# Patient Record
Sex: Female | Born: 1955 | Race: White | Hispanic: No | Marital: Married | Smoking: Current some day smoker
Health system: Southern US, Community
[De-identification: ages and names within clinical notes are randomized; demographics above are authoritative.]

## PROBLEM LIST (undated history)

## (undated) DIAGNOSIS — E782 Mixed hyperlipidemia: Secondary | ICD-10-CM

## (undated) DIAGNOSIS — I2510 Atherosclerotic heart disease of native coronary artery without angina pectoris: Secondary | ICD-10-CM

## (undated) DIAGNOSIS — E7800 Pure hypercholesterolemia, unspecified: Secondary | ICD-10-CM

## (undated) DIAGNOSIS — K219 Gastro-esophageal reflux disease without esophagitis: Secondary | ICD-10-CM

## (undated) DIAGNOSIS — T1490XA Injury, unspecified, initial encounter: Secondary | ICD-10-CM

## (undated) DIAGNOSIS — E119 Type 2 diabetes mellitus without complications: Secondary | ICD-10-CM

## (undated) DIAGNOSIS — M1990 Unspecified osteoarthritis, unspecified site: Secondary | ICD-10-CM

## (undated) DIAGNOSIS — F0781 Postconcussional syndrome: Secondary | ICD-10-CM

## (undated) DIAGNOSIS — F439 Reaction to severe stress, unspecified: Secondary | ICD-10-CM

## (undated) DIAGNOSIS — I10 Essential (primary) hypertension: Secondary | ICD-10-CM

## (undated) HISTORY — DX: Mixed hyperlipidemia: E78.2

## (undated) HISTORY — PX: OTHER SURGICAL HISTORY: SHX169

## (undated) HISTORY — PX: EYE SURGERY: SHX253

## (undated) HISTORY — DX: Reaction to severe stress, unspecified: F43.9

## (undated) HISTORY — DX: Injury, unspecified, initial encounter: T14.90XA

## (undated) HISTORY — DX: Type 2 diabetes mellitus without complications: E11.9

## (undated) HISTORY — DX: Atherosclerotic heart disease of native coronary artery without angina pectoris: I25.10

---

## 2001-01-04 ENCOUNTER — Inpatient Hospital Stay (HOSPITAL_COMMUNITY): Admission: EM | Admit: 2001-01-04 | Discharge: 2001-01-06 | Payer: Self-pay | Admitting: *Deleted

## 2001-01-07 ENCOUNTER — Other Ambulatory Visit (HOSPITAL_COMMUNITY): Admission: RE | Admit: 2001-01-07 | Discharge: 2001-01-13 | Payer: Self-pay | Admitting: Psychiatry

## 2003-06-22 ENCOUNTER — Encounter: Payer: Self-pay | Admitting: *Deleted

## 2003-06-22 ENCOUNTER — Emergency Department (HOSPITAL_COMMUNITY): Admission: EM | Admit: 2003-06-22 | Discharge: 2003-06-22 | Payer: Self-pay | Admitting: *Deleted

## 2007-10-07 ENCOUNTER — Other Ambulatory Visit: Admission: RE | Admit: 2007-10-07 | Discharge: 2007-10-07 | Payer: Self-pay | Admitting: Obstetrics and Gynecology

## 2008-10-19 ENCOUNTER — Other Ambulatory Visit: Admission: RE | Admit: 2008-10-19 | Discharge: 2008-10-19 | Payer: Self-pay | Admitting: Obstetrics and Gynecology

## 2009-11-29 ENCOUNTER — Other Ambulatory Visit: Admission: RE | Admit: 2009-11-29 | Discharge: 2009-11-29 | Payer: Self-pay | Admitting: Obstetrics and Gynecology

## 2010-12-02 ENCOUNTER — Other Ambulatory Visit (HOSPITAL_COMMUNITY)
Admission: RE | Admit: 2010-12-02 | Discharge: 2010-12-02 | Disposition: A | Discharge status: Home / Self Care | Payer: 59 | Source: Ambulatory Visit | Attending: Obstetrics and Gynecology | Admitting: Obstetrics and Gynecology

## 2010-12-02 ENCOUNTER — Other Ambulatory Visit: Payer: Self-pay | Admitting: Adult Health

## 2010-12-02 DIAGNOSIS — Z01419 Encounter for gynecological examination (general) (routine) without abnormal findings: Secondary | ICD-10-CM | POA: Insufficient documentation

## 2011-03-20 NOTE — Procedures (Signed)
   NAME:  Barbara Phillips, Barbara Phillips                            ACCOUNT NO.:  [id number]   MEDICAL RECORD NO.:  [id number]                   PATIENT TYPE:  EMS   LOCATION:  ED                                   FACILITY:  [facility]   PHYSICIAN:  Edward L. Juanetta Gosling, M.D.             DATE OF BIRTH:  04/02/1956   DATE OF PROCEDURE:  DATE OF DISCHARGE:  06/22/2003                                EKG INTERPRETATION   TIME AND DATE:  2022 on 06/22/2003   INTERPRETATION:  The rhythm is sinus rhythm with a rate in the 70s   IMPRESSION:  Normal electrocardiogram.                                               Ramon Dredge L. Juanetta Gosling, M.D.    ELH/MEDQ  D:  06/25/2003  T:  06/25/2003  Job:  [id number]

## 2011-03-20 NOTE — Discharge Summary (Signed)
[facility]  Patient:    Barbara Phillips, Barbara Phillips                         MRN: [id number] Adm. Date:  [id number] Attending:  Milford Cage H                           Discharge Summary  REASON FOR ADMISSION:  Patient is a 55 year old Caucasian female from [facility] referred from the [facility] Intensive Care Unit. Patient had been admitted due to an overdose of Xanax and alcohol.  She was medically stabilized and transferred here.  She states she has been having conflict with her husband of 8 months.  She also has a number of other stressors, including a demanding job and financial problems.  She recognizes now it was "silly" to try to harm herself, but had felt so overwhelmed she did not know where to turn.  For further admission information, see psychiatric admission assessment.  PHYSICAL EXAMINATION:  Patient has a history of chronic bronchitis from smoking.  She is otherwise healthy.  ADMISSION LABORATORIES:  Comprehensive metabolic panel was grossly within normal limits.  CBC was within normal limits.  Free T4 and TSH were within normal limits.  The rest of her labs had been done at the Intensive care unit at [facility] and were evaluated by their physician there.  HOSPITAL COURSE:  Upon admission, patient was begun on Celexa 10 mg q.a.m. x 1 day, then 20 mg q.a.m.  She states she has been on Zoloft in the past but had not stayed on it long enough to be helpful.  She had had some GI side effects to this.  She was able to talk with her husband while here and began to feel more optimistic about the marriage.  A family session was scheduled with him prior to discharge to solidify her disposition.  Patient was able to participate appropriately in unit therapeutic groups and activities.  Her mood had improved markedly at the time of discharge.  DISCHARGE MENTAL STATUS EXAMINATION:  Improved eye contact.  Less psychomotor retardation.  Speech  normal rate and slow.  Mood less depressed, affect brighter, more positive, smiling.  No suicidal or homicidal ideations, no self injurious behavior or aggression.  There was no psychosis or perceptual disturbance.  Thought processes were logical and goal directed.  Cognitive exam:  Patient was back to baseline.  Attention and concentration still somewhat diminished.  Judgment good, insight good.  DISCHARGE DIAGNOSIS: Axis I:     Major depression, recurrent, severe, without psychosis. Axis II:    None. Axis III:   Recent overdose on Xanax and alcohol, medically treated and             currently stable. Axis IV:    Severe. Axis V:     Current global assessment of function is 50, highest past year 75,             admission status 30.  DISCHARGE MEDICATIONS:  Celexa 20 mg p.o. q.a.m.  ACTIVITY RESTRICTIONS:  None.  DIET RESTRICTIONS:  None.  POST HOSPITAL CARE PLAN:  Patient will return to the partial hospital program at [facility] and then will continue to see me at [facility] outpatient behavioral clinic. DD:  01/06/01 TD:  01/06/01 Job: [id number]

## 2011-03-20 NOTE — H&P (Signed)
[facility]  Patient:    Barbara Phillips, Barbara Phillips                         MRN: [id number] Adm. Date:  [id number] Attending:  Jasmine Pang                   Psychiatric Admission Assessment  DATE OF ADMISSION:  January 04, 2001  PATIENT IDENTIFICATION:  A 55 year old Caucasian female from [facility] who was referred from the [facility] Intensive Care Unit.  HISTORY OF PRESENT ILLNESS:  The patient had taken an overdose of approximately 50 Xanax and was drinking alcohol (blood alcohol level 0.15). She states a history of escalating depression which began in July after having to take over the administration of her fathers estate.  He had died three years ago.  She also has gotten remarried and is having conflict with her new husband.  He had made a statement about wanting to leave her, which led to the overdose attempt.  She has been worried about her sons displeasure regarding her remarriage.  She also states she is having financial problems and work related stresses.  PAST PSYCHIATRIC HISTORY:  The patient was depressed last year, treated with Zoloft by her primary care physician.  She had Xanax prescribed in June 2000 for sleep; this is what she overdosed on.  She denied using this on a regular basis.  SUBSTANCE ABUSE HISTORY:  The patient describes herself as a social drinker. She denies using any drugs.  She does smoke one pack of cigarettes per day.  PAST MEDICAL HISTORY:  Primary care Kesa Birky is Amy Johny Sax. at [facility] in [place], [place].  Medical problems: Recent overdose as described above, no other acute health problems except recent sinus infections and flu.  Medications: None.  Drug allergies: No known drug allergies.  SOCIAL HISTORY:  Family psychiatric history: Mother has been depressed for a years.  She has been treated with medications but the patient is unsure of what kind.  Her sister has had a history of  depression.  Social history: The patient is recently remarried for the third time.  She states she and her husband are having conflict.  Her son has been unhappy with the remarriage and she feels anxious about keeping him happy.  She works as a Merchandiser, retail for a [employer]  She states this is a very stressful job.  She denies any legal problems.  ADMISSION MENTAL STATUS EXAMINATION:  Sad, tearful Caucasian female dressed casually with poor eye contact.  Speech: Soft and slow.  Psychomotor retardation.  Mood was depressed, anxious.  Affect: Sad, tearful.  Positive suicidal ideation as per history of present illness; she denies this now.  No homicidal ideation, no psychosis or perceptual disturbance.  Thought processes were logical and goal directed.  Thought content revolves predominantly around the status of her marriage and whether she and her husband are going to stay together.  Cognitive: Alert and oriented x 4.  Short-term and long-term memory were adequate.  General fund of knowledge: Age and education level appropriate.  Attention and concentration: Decreased.  Judgment: Poor. Insight: Fair.  ADMISSION DIAGNOSES: Axis I:    Major depression, recurrent, severe without psychosis. Axis II:   Deferred. Axis III:  Recent overdose on Xanax and alcohol. Axis IV:   Severe. Axis V:    Global assessment of functioning current 30, highest past year 75.  ASSETS AND  STRENGTHS:  Verbal, healthy, supportive extended family.  Problems: Mood instability with suicidal ideation.  INITIAL PLAN OF CARE:  Short-term treatment goal: Resolution of suicidal ideation.  Long-term treatment goal: Resolution of mood instability.  Plan: Begin Celexa 10 mg q.a.m. tomorrow, then 20 mg q.a.m. if tolerated.  Will have case management schedule a family session with husband to determine status of their marriage and disposition issues.  The patient will also be involved in unit therapeutic groups and  activities to decreased suicidal ideation and improve coping strategies.  ESTIMATED LENGTH OF STAY:  Three to five days.  CONDITIONS NECESSARY FOR DISCHARGE:  Not suicidal.  POST HOSPITAL CARE PLAN:  Return to her home.  Follow-up therapy and medication management will be arranged at her local mental health center in [facility] or with a [place] of choice.  These appointments will be made prior to her discharge. DD:  01/04/01 TD:  01/05/01 Job: [id number]

## 2011-05-13 DIAGNOSIS — R079 Chest pain, unspecified: Secondary | ICD-10-CM

## 2011-05-13 DIAGNOSIS — R002 Palpitations: Secondary | ICD-10-CM

## 2011-12-04 ENCOUNTER — Other Ambulatory Visit: Payer: Self-pay | Admitting: Adult Health

## 2011-12-04 ENCOUNTER — Other Ambulatory Visit (HOSPITAL_COMMUNITY)
Admission: RE | Admit: 2011-12-04 | Discharge: 2011-12-04 | Disposition: A | Discharge status: Home / Self Care | Payer: 59 | Source: Ambulatory Visit | Attending: Obstetrics and Gynecology | Admitting: Obstetrics and Gynecology

## 2011-12-04 DIAGNOSIS — Z01419 Encounter for gynecological examination (general) (routine) without abnormal findings: Secondary | ICD-10-CM | POA: Insufficient documentation

## 2011-12-08 ENCOUNTER — Telehealth (INDEPENDENT_AMBULATORY_CARE_PROVIDER_SITE_OTHER): Payer: Self-pay | Admitting: *Deleted

## 2011-12-08 NOTE — Telephone Encounter (Signed)
Barbara Phillips called to schedule her 5 year tcs. The last one was done in [place] of 2008. There were polops removed. Advised [name] we have her down for her tcs in 2018, 10 years. Please give her a return call at [phone].

## 2011-12-10 NOTE — Telephone Encounter (Signed)
Left message for patient to call me

## 2011-12-11 NOTE — Telephone Encounter (Signed)
Per Dr Karilyn Cota recommendations on last TCS, patient due 08/2012, she is aware

## 2012-03-09 ENCOUNTER — Encounter (INDEPENDENT_AMBULATORY_CARE_PROVIDER_SITE_OTHER): Payer: Self-pay

## 2012-07-26 ENCOUNTER — Telehealth (INDEPENDENT_AMBULATORY_CARE_PROVIDER_SITE_OTHER): Payer: Self-pay | Admitting: *Deleted

## 2012-07-26 ENCOUNTER — Other Ambulatory Visit (INDEPENDENT_AMBULATORY_CARE_PROVIDER_SITE_OTHER): Payer: Self-pay | Admitting: *Deleted

## 2012-07-26 DIAGNOSIS — Z800 Family history of malignant neoplasm of digestive organs: Secondary | ICD-10-CM

## 2012-07-26 DIAGNOSIS — Z1211 Encounter for screening for malignant neoplasm of colon: Secondary | ICD-10-CM

## 2012-07-26 DIAGNOSIS — Z86010 Personal history of colonic polyps: Secondary | ICD-10-CM

## 2012-07-26 MED ORDER — PEG-KCL-NACL-NASULF-NA ASC-C 100 G PO SOLR: 1.0000 | Freq: Once | ORAL | Status: DC

## 2012-07-26 NOTE — Telephone Encounter (Signed)
Patient needs movi prep 

## 2012-08-11 ENCOUNTER — Telehealth (INDEPENDENT_AMBULATORY_CARE_PROVIDER_SITE_OTHER): Payer: Self-pay | Admitting: *Deleted

## 2012-08-11 NOTE — Telephone Encounter (Signed)
agree

## 2012-08-11 NOTE — Telephone Encounter (Signed)
PCP/Requesting MD: amy boyd  Name & DOB: Barbara Phillips 06/16/1956   Procedure: tcs  Reason/Indication:  Hx polyps, fam hx colon ca  Has patient had this procedure before?  yes  If so, when, by whom and where?  10/08  Is there a family history of colon cancer?  yes  Who?  What age when diagnosed?  father  Is patient diabetic?   no      Does patient have prosthetic heart valve?  no  Do you have a pacemaker?  no  Has patient had joint replacement within last 12 months?  no  Is patient on Coumadin, Plavix and/or Aspirin? no  Medications: fish oil 1000 mg daily, vit d3 1000 mg daily, co q 10 200 mg daily, simvastatin 40 mg daily, omeprazole 20 mg daily  Allergies: nkda  Medication Adjustment:   Procedure date & time: 09/08/12 at 830

## 2012-08-31 ENCOUNTER — Encounter (HOSPITAL_COMMUNITY): Payer: Self-pay | Admitting: Pharmacy Technician

## 2012-09-08 ENCOUNTER — Encounter (HOSPITAL_COMMUNITY)
Admission: RE | Disposition: A | Discharge status: Home / Self Care | Payer: Self-pay | Source: Ambulatory Visit | Attending: Internal Medicine

## 2012-09-08 ENCOUNTER — Ambulatory Visit (HOSPITAL_COMMUNITY)
Admission: RE | Admit: 2012-09-08 | Discharge: 2012-09-08 | Disposition: A | Discharge status: Home / Self Care | Payer: 59 | Source: Ambulatory Visit | Attending: Internal Medicine | Admitting: Internal Medicine

## 2012-09-08 ENCOUNTER — Encounter (HOSPITAL_COMMUNITY): Payer: Self-pay | Admitting: *Deleted

## 2012-09-08 DIAGNOSIS — D126 Benign neoplasm of colon, unspecified: Secondary | ICD-10-CM | POA: Insufficient documentation

## 2012-09-08 DIAGNOSIS — Z800 Family history of malignant neoplasm of digestive organs: Secondary | ICD-10-CM

## 2012-09-08 DIAGNOSIS — K219 Gastro-esophageal reflux disease without esophagitis: Secondary | ICD-10-CM | POA: Insufficient documentation

## 2012-09-08 DIAGNOSIS — K6389 Other specified diseases of intestine: Secondary | ICD-10-CM

## 2012-09-08 DIAGNOSIS — E7800 Pure hypercholesterolemia, unspecified: Secondary | ICD-10-CM | POA: Insufficient documentation

## 2012-09-08 DIAGNOSIS — K5520 Angiodysplasia of colon without hemorrhage: Secondary | ICD-10-CM | POA: Insufficient documentation

## 2012-09-08 DIAGNOSIS — Z86010 Personal history of colonic polyps: Secondary | ICD-10-CM

## 2012-09-08 HISTORY — DX: Gastro-esophageal reflux disease without esophagitis: K21.9

## 2012-09-08 HISTORY — PX: COLONOSCOPY: SHX5424

## 2012-09-08 HISTORY — DX: Pure hypercholesterolemia, unspecified: E78.00

## 2012-09-08 SURGERY — COLONOSCOPY
Anesthesia: Moderate Sedation

## 2012-09-08 MED ORDER — STERILE WATER FOR IRRIGATION IR SOLN
Status: DC | PRN
  Administered 2012-09-08: 09:00:00

## 2012-09-08 MED ORDER — MEPERIDINE HCL 50 MG/ML IJ SOLN
INTRAMUSCULAR | Status: AC
  Filled 2012-09-08: qty 1

## 2012-09-08 MED ORDER — MIDAZOLAM HCL 5 MG/5ML IJ SOLN
INTRAMUSCULAR | Status: DC | PRN
  Administered 2012-09-08: 1 mg via INTRAVENOUS
  Administered 2012-09-08 (×2): 2 mg via INTRAVENOUS

## 2012-09-08 MED ORDER — MIDAZOLAM HCL 5 MG/5ML IJ SOLN
INTRAMUSCULAR | Status: AC
  Filled 2012-09-08: qty 10

## 2012-09-08 MED ORDER — SODIUM CHLORIDE 0.45 % IV SOLN
INTRAVENOUS | Status: DC
  Administered 2012-09-08: 08:00:00 via INTRAVENOUS

## 2012-09-08 MED ORDER — MEPERIDINE HCL 50 MG/ML IJ SOLN
INTRAMUSCULAR | Status: DC | PRN
  Administered 2012-09-08 (×2): 25 mg via INTRAVENOUS

## 2012-09-08 NOTE — Op Note (Addendum)
COLONOSCOPY PROCEDURE REPORT  PATIENT:  Barbara Phillips  MR#:  [id number] Birthdate:  [birth year], 56 y.o., female Endoscopist:  Dr. Malissa Hippo, MD Referred By:   Ms. Roxine Caddy, [facility] Procedure Date: 09/08/2012  Procedure:   Colonoscopy  Indications:  Patient is 56 year old Caucasian female is undergoing colonoscopy primarily for screening purposes. Family history is positive for colon carcinoma father was diagnosed in a 5. Her last exam was 5 years ago she had one polyp removed and was not an adenoma.  Informed Consent:  The procedure and risks were reviewed with the patient and informed consent was obtained.  Medications:  Demerol 50 mg IV Versed 5 mg IV  Description of procedure:  After a digital rectal exam was performed, that colonoscope was advanced from the anus through the rectum and colon to the area of the cecum, ileocecal valve and appendiceal orifice. The cecum was deeply intubated. These structures were well-seen and photographed for the record. From the level of the cecum and ileocecal valve, the scope was slowly and cautiously withdrawn. The mucosal surfaces were carefully surveyed utilizing scope tip to flexion to facilitate fold flattening as needed. The scope was pulled down into the rectum where a thorough exam including retroflexion was performed.  Findings:   Prep excellent. 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 Three small polyps ablated via cold biopsy and submitted together. One was at junction of sigmoid and descending colon and the 2 others were at sigmoid colon. Normal rectal mucosa. Small anal papilla.  Therapeutic/Diagnostic Maneuvers Performed:  See above  Complications:  None  Cecal Withdrawal Time:  17 minutes  Impression:  Examination performed to cecum. 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 Small anal papilla.  Recommendations:  Standard  instructions given. I would be contacting patient with results of biopsy and further recommendations.  REHMAN,NAJEEB U  09/08/2012 9:16 AM  CC: Dr. Leavy Cella, Amy H, PA & Dr. Bonnetta Barry ref. provider found

## 2012-09-08 NOTE — H&P (Signed)
Barbara Phillips is an 56 y.o. female.   Chief Complaint: Patient is here for colonoscopy. HPI: This 56 year old Caucasian female is here for colonoscopy. She had single polyp removed her last exam 5 years ago at [facility] in [facility]. This polyp was not an adenoma. She denies abdominal pain change in bowel habits or rectal bleeding. Family history significant for colon carcinoma in his father who is 56 at the time of diagnosis died of CVA before he could be treated.  Past Medical History  Diagnosis Date  . GERD (gastroesophageal reflux disease)   . Hypercholesteremia     History reviewed. No pertinent past surgical history.  Family History  Problem Relation Age of Onset  . Colon cancer Father    Social History:  reports that she has been smoking Cigarettes.  She has a 20 pack-year smoking history. She does not have any smokeless tobacco history on file. She reports that she drinks alcohol. She reports that she does not use illicit drugs.  Allergies: No Known Allergies  Medications Prior to Admission  Medication Sig Dispense Refill  . B Complex-C (B-COMPLEX WITH VITAMIN C) tablet Take 1 tablet by mouth daily.      . Cholecalciferol (VITAMIN D3) 2000 UNITS capsule Take 2,000 Units by mouth daily.      . Coenzyme Q10 (CO Q 10) 100 MG CAPS Take 1 capsule by mouth daily.      . cycloSPORINE (RESTASIS) 0.05 % ophthalmic emulsion Place 1 drop into both eyes 2 (two) times daily.      Marland Kitchen ibuprofen (ADVIL,MOTRIN) 800 MG tablet Take 800 mg by mouth daily as needed. Knee pain.      . Omega-3 Fatty Acids (FISH OIL) 1000 MG CAPS Take 1 capsule by mouth daily.      Marland Kitchen omeprazole (PRILOSEC) 20 MG capsule Take 20 mg by mouth daily.      . peg 3350 powder (MOVIPREP) 100 G SOLR Take 1 kit (100 g total) by mouth once.  1 kit  0  . Polyethyl Glycol-Propyl Glycol (SYSTANE OP) Apply 1 drop to eye 2 (two) times daily.      . simvastatin (ZOCOR) 40 MG tablet Take 40 mg by mouth every evening.        No  results found for this or any previous visit (from the past 48 hour(s)). No results found.  ROS  Blood pressure 115/74, pulse 90, temperature 97.7 F (36.5 C), temperature source Oral, resp. rate 18, height 5' 0.5" (1.537 m), weight 115 lb (52.164 kg), SpO2 96.00%. Physical Exam  Constitutional: She is oriented to person, place, and time. She appears well-developed and well-nourished.  HENT:  Mouth/Throat: Oropharynx is clear and moist.  Eyes: Conjunctivae normal are normal. No scleral icterus.  Neck: No thyromegaly present.  Cardiovascular: Normal rate, regular rhythm and normal heart sounds.   No murmur heard. Respiratory: Effort normal and breath sounds normal.  GI: Soft. She exhibits no distension and no mass. There is no tenderness.  Musculoskeletal: She exhibits no edema.  Lymphadenopathy:    She has no cervical adenopathy.  Neurological: She is alert and oriented to person, place, and time.  Skin: Skin is warm.     Assessment/Plan High-risk screening colonoscopy. Family history of colon carcinoma in first degree relative.    Spyros Winch U 09/08/2012, 8:36 AM

## 2012-09-13 ENCOUNTER — Encounter (HOSPITAL_COMMUNITY): Payer: Self-pay | Admitting: Internal Medicine

## 2012-09-28 ENCOUNTER — Encounter (INDEPENDENT_AMBULATORY_CARE_PROVIDER_SITE_OTHER): Payer: Self-pay | Admitting: *Deleted

## 2012-12-07 ENCOUNTER — Other Ambulatory Visit (HOSPITAL_COMMUNITY)
Admission: RE | Admit: 2012-12-07 | Discharge: 2012-12-07 | Disposition: A | Discharge status: Home / Self Care | Payer: 59 | Source: Ambulatory Visit | Attending: Obstetrics and Gynecology | Admitting: Obstetrics and Gynecology

## 2012-12-07 ENCOUNTER — Other Ambulatory Visit: Payer: Self-pay | Admitting: Adult Health

## 2012-12-07 DIAGNOSIS — Z1151 Encounter for screening for human papillomavirus (HPV): Secondary | ICD-10-CM | POA: Insufficient documentation

## 2012-12-07 DIAGNOSIS — Z01419 Encounter for gynecological examination (general) (routine) without abnormal findings: Secondary | ICD-10-CM | POA: Insufficient documentation

## 2013-03-29 ENCOUNTER — Telehealth: Payer: Self-pay | Admitting: Adult Health

## 2013-03-29 MED ORDER — TRIAMCINOLONE ACETONIDE 0.025 % EX OINT: TOPICAL_OINTMENT | Freq: Two times a day (BID) | CUTANEOUS | Status: DC

## 2013-03-29 NOTE — Telephone Encounter (Signed)
Has rash on leg, that she gets about once a year and uses triamcinolon, needs refill, will refill at [facility] in [place]

## 2013-12-11 ENCOUNTER — Encounter: Payer: Self-pay | Admitting: Adult Health

## 2013-12-11 ENCOUNTER — Ambulatory Visit (INDEPENDENT_AMBULATORY_CARE_PROVIDER_SITE_OTHER): Payer: 59 | Admitting: Adult Health

## 2013-12-11 VITALS — BP 130/78 | HR 76 | Ht 61.0 in | Wt 122.0 lb

## 2013-12-11 DIAGNOSIS — F439 Reaction to severe stress, unspecified: Secondary | ICD-10-CM

## 2013-12-11 DIAGNOSIS — Z1212 Encounter for screening for malignant neoplasm of rectum: Secondary | ICD-10-CM

## 2013-12-11 DIAGNOSIS — Z01419 Encounter for gynecological examination (general) (routine) without abnormal findings: Secondary | ICD-10-CM

## 2013-12-11 HISTORY — DX: Reaction to severe stress, unspecified: F43.9

## 2013-12-11 LAB — HEMOCCULT GUIAC POC 1CARD (OFFICE): FECAL OCCULT BLD: NEGATIVE

## 2013-12-11 MED ORDER — ALPRAZOLAM 0.25 MG PO TABS: 0.2500 mg | ORAL_TABLET | Freq: Every evening | ORAL | Status: DC | PRN

## 2013-12-11 MED ORDER — TRIAMCINOLONE ACETONIDE 0.025 % EX OINT: TOPICAL_OINTMENT | Freq: Two times a day (BID) | CUTANEOUS | Status: DC

## 2013-12-11 NOTE — Patient Instructions (Signed)
Stress Stress-related medical problems are becoming increasingly common. The body has a built-in physical response to stressful situations. Faced with pressure, challenge or danger, we need to react quickly. Our bodies release hormones such as cortisol and adrenaline to help do this. These hormones are part of the "fight or flight" response and affect the metabolic rate, heart rate and blood pressure, resulting in a heightened, stressed state that prepares the body for optimum performance in dealing with a stressful situation. It is likely that early man required these mechanisms to stay alive, but usually modern stresses do not call for this, and the same hormones released in today's world can damage health and reduce coping ability. CAUSES  Pressure to perform at work, at school or in sports.  Threats of physical violence.  Money worries.  Arguments.  Family conflicts.  Divorce or separation from significant other.  Bereavement.  New job or unemployment.  Changes in location.  Alcohol or drug abuse. SOMETIMES, THERE IS NO PARTICULAR REASON FOR DEVELOPING STRESS. Almost all people are at risk of being stressed at some time in their lives. It is important to know that some stress is temporary and some is long term.  Temporary stress will go away when a situation is resolved. Most people can cope with short periods of stress, and it can often be relieved by relaxing, taking a walk, chatting through issues with friends, or having a good night's sleep.  Chronic (long-term, continuous) stress is much harder to deal with. It can be psychologically and emotionally damaging. It can be harmful both for an individual and for friends and family. SYMPTOMS Everyone reacts to stress differently. There are some common effects that help [place] recognize it. In times of extreme stress, people may:  Shake uncontrollably.  Breathe faster and deeper than normal (hyperventilate).  Vomit.  For people  with asthma, stress can trigger an attack.  For some people, stress may trigger migraine headaches, ulcers, and body pain. PHYSICAL EFFECTS OF STRESS MAY INCLUDE:  Loss of energy.  Skin problems.  Aches and pains resulting from tense muscles, including neck ache, backache and tension headaches.  Increased pain from arthritis and other conditions.  Irregular heart beat (palpitations).  Periods of irritability or anger.  Apathy or depression.  Anxiety (feeling uptight or worrying).  Unusual behavior.  Loss of appetite.  Comfort eating.  Lack of concentration.  Loss of, or decreased, sex-drive.  Increased smoking, drinking, or recreational drug use.  For women, missed periods.  Ulcers, joint pain, and muscle pain. Post-traumatic stress is the stress caused by any serious accident, strong emotional damage, or extremely difficult or violent experience such as rape or war. Post-traumatic stress victims can experience mixtures of emotions such as fear, shame, depression, guilt or anger. It may include recurrent memories or images that may be haunting. These feelings can last for weeks, months or even years after the traumatic event that triggered them. Specialized treatment, possibly with medicines and psychological therapies, is available. If stress is causing physical symptoms, severe distress or making it difficult for you to function as normal, it is worth seeing your caregiver. It is important to remember that although stress is a usual part of life, extreme or prolonged stress can lead to other illnesses that will need treatment. It is better to visit a doctor sooner rather than later. Stress has been linked to the development of high blood pressure and heart disease, as well as insomnia and depression. There is no diagnostic test for  stress since everyone reacts to it differently. But a caregiver will be able to spot the physical symptoms, such  as:  Headaches.  Shingles.  Ulcers. Emotional distress such as intense worry, low mood or irritability should be detected when the doctor asks pertinent questions to identify any underlying problems that might be the cause. In case there are physical reasons for the symptoms, the doctor may also want to do some tests to exclude certain conditions. If you feel that you are suffering from stress, try to identify the aspects of your life that are causing it. Sometimes you may not be able to change or avoid them, but even a small change can have a positive ripple effect. A simple lifestyle change can make all the difference. STRATEGIES THAT CAN HELP DEAL WITH STRESS:  Delegating or sharing responsibilities.  Avoiding confrontations.  Learning to be more assertive.  Regular exercise.  Avoid using alcohol or street drugs to cope.  Eating a healthy, balanced diet, rich in fruit and vegetables and proteins.  Finding humor or absurdity in stressful situations.  Never taking on more than you know you can handle comfortably.  Organizing your time better to get as much done as possible.  Talking to friends or family and sharing your thoughts and fears.  Listening to music or relaxation tapes.  Tensing and then relaxing your muscles, starting at the toes and working up to the head and neck. If you think that you would benefit from help, either in identifying the things that are causing your stress or in learning techniques to help you relax, see a caregiver who is capable of helping you with this. Rather than relying on medications, it is usually better to try and identify the things in your life that are causing stress and try to deal with them. There are many techniques of managing stress including counseling, psychotherapy, aromatherapy, yoga, and exercise. Your caregiver can help you determine what is best for you. Document Released: 01/09/2003 Document Revised: 01/11/2012 Document  Reviewed: 12/06/2007 [facility] Patient Information 2014 [place], [place]. Physical in 1 year Mammogram 2/19 and yearly Try xanax for sleep Labs with PCP

## 2013-12-11 NOTE — Progress Notes (Signed)
Patient ID: Barbara Phillips, female   DOB: 1956/01/12, 59 y.o.   MRN: [id number] History of Present Illness: Barbara Phillips is a 57 year old white female, married, in for a physical.She had a normal pap with negative HPV 12/07/12.She is having trouble sleeping, she is stressed over having to oversee care of 78 year old Mom, without help of her sisters.   Current Medications, Allergies, Past Medical History, Past Surgical History, Family History and Social History were reviewed in [employer] record.     Review of Systems: Patient denies any headaches, blurred vision, shortness of breath, chest pain, abdominal pain, problems with urination, or intercourse. No joint pain or depression, just stressed so does not sleep well.She has some constipation and uses miralax.Works second shift.    Physical Exam:BP 130/78  Pulse 76  Ht 5\' 1"  (1.549 m)  Wt 122 lb (55.339 kg)  BMI 23.06 kg/m2 General:  Well developed, well nourished, no acute distress Skin:  Warm and dry Neck:  Midline trachea, normal thyroid Lungs; Clear to auscultation bilaterally Breast:  No dominant palpable mass, retraction, or nipple discharge Cardiovascular: Regular rate and rhythm Abdomen:  Soft, non tender, no hepatosplenomegaly, has vertical scar from C section. Pelvic:  External genitalia is normal in appearance.  The vagina is normal in appearance. The cervix is atrophic.  Uterus is felt to be normal size, shape, and contour.  No  adnexal masses or tenderness noted. Rectal: Good sphincter tone, no polyps, or hemorrhoids felt.  Hemoccult negative. Extremities:  No swelling or varicosities noted Psych:  No mood changes, alert and cooperative   Impression: Yearly gyn exam no pap Stress affected sleep    Plan: Rx xanax 0.25 mg #30 1 at hs prn no refills Refilled kenalog cream Physical in 1 year Mammogram 2/19 and yearly Labs with PCP Colonoscopy per GI Review handout on stress Call prn problems

## 2014-02-07 ENCOUNTER — Encounter: Payer: Self-pay | Admitting: Adult Health

## 2014-07-31 ENCOUNTER — Encounter (INDEPENDENT_AMBULATORY_CARE_PROVIDER_SITE_OTHER): Payer: Self-pay | Admitting: *Deleted

## 2014-07-31 ENCOUNTER — Ambulatory Visit (INDEPENDENT_AMBULATORY_CARE_PROVIDER_SITE_OTHER): Payer: 59 | Admitting: Internal Medicine

## 2014-07-31 ENCOUNTER — Encounter (INDEPENDENT_AMBULATORY_CARE_PROVIDER_SITE_OTHER): Payer: Self-pay | Admitting: Internal Medicine

## 2014-07-31 VITALS — BP 132/66 | HR 72 | Temp 98.0°F | Ht 61.0 in | Wt 131.0 lb

## 2014-07-31 DIAGNOSIS — R079 Chest pain, unspecified: Secondary | ICD-10-CM | POA: Insufficient documentation

## 2014-07-31 NOTE — Progress Notes (Signed)
Subjective:    Patient ID: Barbara Phillips, female    DOB: [birth year], 58 y.o.   MRN: [id number]  HPI Patient is a self referral. Labor day morning she c/o back, chest, and jaw pain. She saw Dr. Quintin Alto. EKG abnormal per patient. She was referred to a Cardiologist (Dr. Joellyn Haff). She underwent a stress test and echo cardiogram. She also had an US of the GB. The gallbladder US was normal. Stress test and echo were normal.  She tells me she has had seven episodes of this. Pain radiates into her jaw. She has fullness in her chest.  She does have acid reflux. Sometimes she will vomit if the acid reflux becomes bad.  She says the squeezing chest pain and the jaw pain is unbearable. Appetite is good. No weight loss. She has acid reflux usually on a daily basis. Takes Omeprazole 20mg  daily.    Colonoscopy 2013:  Indications: Patient is 58 year old Caucasian female is undergoing colonoscopy primarily for screening purposes. Family history is positive for colon carcinoma father was diagnosed in a 87. Her last exam was 5 years ago she had one polyp removed and was not an adenoma. Impression:  Examination performed to cecum.  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  Small anal papilla.   Review of Systems Past Medical History  Diagnosis Date  . GERD (gastroesophageal reflux disease)   . Hypercholesteremia   . Stress at home 12/11/2013    Has to oversee care of 48 year old mom who lives an hour away    Past Surgical History  Procedure Laterality Date  . Colonoscopy  09/08/2012    Procedure: COLONOSCOPY;  Surgeon: Rogene Houston, MD;  Location: [facility];  Service: Endoscopy;  Laterality: N/A;  830  . Cesarean section    . Eye surgery      No Known Allergies  Current Outpatient Prescriptions on File Prior to Visit  Medication Sig Dispense Refill  . ALPRAZolam (XANAX) 0.25 MG tablet Take 1 tablet  (0.25 mg total) by mouth at bedtime as needed for anxiety.  30 tablet  0  . B Complex-C (B-COMPLEX WITH VITAMIN C) tablet Take 1 tablet by mouth daily.      . Cholecalciferol (VITAMIN D3) 2000 UNITS capsule Take 2,000 Units by mouth daily.      . Coenzyme Q10 (CO Q 10) 100 MG CAPS Take 1 capsule by mouth daily.      . cycloSPORINE (RESTASIS) 0.05 % ophthalmic emulsion Place 1 drop into both eyes 2 (two) times daily.      Marland Kitchen ibuprofen (ADVIL,MOTRIN) 800 MG tablet Take 800 mg by mouth daily as needed. Knee pain.      . Omega-3 Fatty Acids (FISH OIL) 1000 MG CAPS Take 1 capsule by mouth daily.      Marland Kitchen omeprazole (PRILOSEC) 20 MG capsule Take 20 mg by mouth daily.      Vladimir Faster Glycol-Propyl Glycol (SYSTANE OP) Apply 1 drop to eye 2 (two) times daily.      . simvastatin (ZOCOR) 40 MG tablet Take 40 mg by mouth every evening.      . triamcinolone (KENALOG) 0.025 % ointment Apply topically 2 (two) times daily.  30 g  1   No current facility-administered medications on file prior to visit.        Objective:   Physical Exam  Filed Vitals:   07/31/14 1533  BP: 132/66  Pulse: 72  Temp: 98 F (36.7 C)  Height: 5\' 1"  (1.549 m)  Weight: 131 lb (59.421 kg)   Alert and oriented. Skin warm and dry. Oral mucosa is moist.   . Sclera anicteric, conjunctivae is pink. Thyroid not enlarged. No cervical lymphadenopathy. Lungs clear. Heart regular rate and rhythm.  Abdomen is soft. Bowel sounds are positive. No hepatomegaly. No abdominal masses felt. No tenderness.  No edema to lower extremities.          Assessment & Plan:  Chest pain, atypical.  Cardiac work up was negative. I discussed with Dr. Laural Golden.  EGD

## 2014-07-31 NOTE — Patient Instructions (Signed)
EGD. The risks and benefits such as perforation, bleeding, and infection were reviewed with the patient and is agreeable. 

## 2014-08-01 ENCOUNTER — Encounter (HOSPITAL_COMMUNITY): Payer: Self-pay | Admitting: Pharmacy Technician

## 2014-08-01 ENCOUNTER — Other Ambulatory Visit (INDEPENDENT_AMBULATORY_CARE_PROVIDER_SITE_OTHER): Payer: Self-pay | Admitting: *Deleted

## 2014-08-01 DIAGNOSIS — R079 Chest pain, unspecified: Secondary | ICD-10-CM

## 2014-08-10 ENCOUNTER — Ambulatory Visit (HOSPITAL_COMMUNITY)
Admission: RE | Admit: 2014-08-10 | Discharge: 2014-08-10 | Disposition: A | Discharge status: Home / Self Care | Payer: 59 | Source: Ambulatory Visit | Attending: Internal Medicine | Admitting: Internal Medicine

## 2014-08-10 ENCOUNTER — Encounter (HOSPITAL_COMMUNITY): Payer: Self-pay | Admitting: *Deleted

## 2014-08-10 ENCOUNTER — Encounter (HOSPITAL_COMMUNITY)
Admission: RE | Disposition: A | Discharge status: Home / Self Care | Payer: Self-pay | Source: Ambulatory Visit | Attending: Internal Medicine

## 2014-08-10 DIAGNOSIS — Z79899 Other long term (current) drug therapy: Secondary | ICD-10-CM | POA: Insufficient documentation

## 2014-08-10 DIAGNOSIS — K2950 Unspecified chronic gastritis without bleeding: Secondary | ICD-10-CM

## 2014-08-10 DIAGNOSIS — K449 Diaphragmatic hernia without obstruction or gangrene: Secondary | ICD-10-CM

## 2014-08-10 DIAGNOSIS — K219 Gastro-esophageal reflux disease without esophagitis: Secondary | ICD-10-CM | POA: Insufficient documentation

## 2014-08-10 DIAGNOSIS — R12 Heartburn: Secondary | ICD-10-CM | POA: Diagnosis not present

## 2014-08-10 DIAGNOSIS — F17210 Nicotine dependence, cigarettes, uncomplicated: Secondary | ICD-10-CM | POA: Diagnosis not present

## 2014-08-10 DIAGNOSIS — R1310 Dysphagia, unspecified: Secondary | ICD-10-CM | POA: Insufficient documentation

## 2014-08-10 DIAGNOSIS — Z7982 Long term (current) use of aspirin: Secondary | ICD-10-CM | POA: Diagnosis not present

## 2014-08-10 DIAGNOSIS — R079 Chest pain, unspecified: Secondary | ICD-10-CM

## 2014-08-10 DIAGNOSIS — E780 Pure hypercholesterolemia: Secondary | ICD-10-CM | POA: Diagnosis not present

## 2014-08-10 HISTORY — PX: ESOPHAGOGASTRODUODENOSCOPY: SHX5428

## 2014-08-10 SURGERY — EGD (ESOPHAGOGASTRODUODENOSCOPY)
Anesthesia: Moderate Sedation

## 2014-08-10 MED ORDER — SODIUM CHLORIDE 0.9 % IV SOLN
INTRAVENOUS | Status: DC
  Administered 2014-08-10: 11:00:00 via INTRAVENOUS

## 2014-08-10 MED ORDER — MEPERIDINE HCL 50 MG/ML IJ SOLN
INTRAMUSCULAR | Status: AC
  Filled 2014-08-10: qty 1

## 2014-08-10 MED ORDER — PANTOPRAZOLE SODIUM 40 MG PO TBEC: 40.0000 mg | DELAYED_RELEASE_TABLET | Freq: Every day | ORAL | Status: DC

## 2014-08-10 MED ORDER — MIDAZOLAM HCL 5 MG/5ML IJ SOLN
INTRAMUSCULAR | Status: DC | PRN
  Administered 2014-08-10 (×3): 2 mg via INTRAVENOUS

## 2014-08-10 MED ORDER — MIDAZOLAM HCL 5 MG/5ML IJ SOLN
INTRAMUSCULAR | Status: AC
  Filled 2014-08-10: qty 10

## 2014-08-10 MED ORDER — BUTAMBEN-TETRACAINE-BENZOCAINE 2-2-14 % EX AERO
INHALATION_SPRAY | CUTANEOUS | Status: DC | PRN
  Administered 2014-08-10: 2 via TOPICAL

## 2014-08-10 MED ORDER — MEPERIDINE HCL 50 MG/ML IJ SOLN
INTRAMUSCULAR | Status: DC | PRN
  Administered 2014-08-10 (×2): 25 mg via INTRAVENOUS

## 2014-08-10 NOTE — H&P (Signed)
Barbara Phillips is an 58 y.o. female.   Chief Complaint: Patient is here for EGD and possible ED. HPI: Patient is a 58 year old Caucasian female who presents history of squeezing type of retrosternal pain. First episode occurred on Labor Day weekend. She said 7 or 8 episodes altogether for her which were quite intense. She has undergone cardiac evaluation which was negative. She'll set up an abdominal ultrasound and was negative for cholelithiasis. She has chronic GERD. She is having intermittent heartburn as well as regurgitation. She also complains of dysphagia. She points to the suprasternal area food gets caught up. She denies nausea vomiting or melena. He rarely takes ibuprofen for ankle pain.  Past Medical History  Diagnosis Date  . GERD (gastroesophageal reflux disease)   . Hypercholesteremia   . Stress at home 12/11/2013    Has to oversee care of 62 year old mom who lives an hour away    Past Surgical History  Procedure Laterality Date  . Colonoscopy  09/08/2012    Procedure: COLONOSCOPY;  Surgeon: Rogene Houston, MD;  Location: [facility];  Service: Endoscopy;  Laterality: N/A;  830  . Cesarean section    . Eye surgery      Family History  Problem Relation Age of Onset  . Colon cancer Father   . Stroke Father   . Thyroid disease Mother   . Hypertension Mother   . Diabetes Sister   . Hyperlipidemia Sister   . Hypertension Sister   . Hyperlipidemia Son   . Heart disease Maternal Aunt   . Hypertension Maternal Aunt   . Hyperlipidemia Maternal Aunt   . Diabetes Maternal Aunt   . Hypertension Maternal Uncle   . Hyperlipidemia Maternal Uncle   . Heart disease Maternal Uncle   . Diabetes Maternal Uncle   . Diabetes Paternal Aunt   . Heart disease Paternal Aunt   . Hyperlipidemia Paternal Aunt   . Hypertension Paternal Aunt   . Diabetes Paternal Uncle   . Heart disease Paternal Uncle   . Hyperlipidemia Paternal Uncle   . Hypertension Paternal Uncle   . Heart attack  Maternal Grandmother   . Cancer Paternal Grandfather    Social History:  reports that she has been smoking Cigarettes.  She has a 40 pack-year smoking history. She has never used smokeless tobacco. She reports that she drinks alcohol. She reports that she does not use illicit drugs.  Allergies: No Known Allergies  Medications Prior to Admission  Medication Sig Dispense Refill  . ALPRAZolam (XANAX) 0.25 MG tablet Take 1 tablet (0.25 mg total) by mouth at bedtime as needed for anxiety.  30 tablet  0  . aspirin (ASPIRIN EC) 81 MG EC tablet Take 81 mg by mouth daily. Swallow whole.      . B Complex-C (B-COMPLEX WITH VITAMIN C) tablet Take 1 tablet by mouth daily.      . Cholecalciferol (VITAMIN D3) 2000 UNITS capsule Take 2,000 Units by mouth daily.      . Coenzyme Q10 (CO Q 10) 100 MG CAPS Take 1 capsule by mouth daily.      . cycloSPORINE (RESTASIS) 0.05 % ophthalmic emulsion Place 1 drop into both eyes 2 (two) times daily.      Marland Kitchen ibuprofen (ADVIL,MOTRIN) 800 MG tablet Take 800 mg by mouth daily as needed. Knee pain.      . Omega-3 Fatty Acids (FISH OIL) 1000 MG CAPS Take 1 capsule by mouth daily.      Marland Kitchen omeprazole (  PRILOSEC) 20 MG capsule Take 20 mg by mouth daily.      Vladimir Faster Glycol-Propyl Glycol (SYSTANE OP) Apply 1 drop to eye 2 (two) times daily.      . simvastatin (ZOCOR) 40 MG tablet Take 40 mg by mouth every evening.      . triamcinolone (KENALOG) 0.025 % ointment Apply 1 application topically 2 (two) times daily as needed (rash).        No results found for this or any previous visit (from the past 48 hour(s)). No results found.  ROS  Blood pressure 114/69, pulse 79, temperature 97.9 F (36.6 C), temperature source Oral, resp. rate 19, height 5\' 1"  (1.549 m), weight 131 lb (59.421 kg), SpO2 100.00%. Physical Exam  Constitutional: She appears well-developed and well-nourished.  HENT:  Mouth/Throat: Oropharynx is clear and moist.  Eyes: Conjunctivae are normal. No scleral  icterus.  Neck: No thyromegaly present.  Cardiovascular: Normal rate, regular rhythm and normal heart sounds.   No murmur heard. Respiratory: Effort normal and breath sounds normal.  GI: Soft. She exhibits no distension and no mass. There is tenderness (mild midepigastric tenderness.).  Musculoskeletal: She exhibits no edema.  Lymphadenopathy:    She has no cervical adenopathy.  Neurological: She is alert.  Skin: Skin is warm and dry.     Assessment/Plan: Noncardiac chest pain. Chronic GERD. Dysphagia. EGD and possible esophageal dilation.  Barbara Phillips U 08/10/2014, 11:25 AM

## 2014-08-10 NOTE — Discharge Instructions (Signed)
Discontinue omeprazole but continue other medications. Pantoprazole 40 mg by mouth 30 minutes before breakfast and evening daily. Resumed usual diet. No driving for 24 hours. Symptom diary until office visit in eight weeks.

## 2014-08-10 NOTE — Op Note (Signed)
EGD PROCEDURE REPORT  PATIENT:  Barbara Phillips  MR#:  [id number] Birthdate:  1955/11/18, 58 y.o., female Endoscopist:  Dr. Rogene Houston, MD Referred By:  Ms. Kassie Mends PA-C  Procedure Date: 08/10/2014  Procedure:   EGD with ED.  Indications:  Patient is 58 year old Caucasian female who has chronic GERD and maintain a good result. She's been having squeezing type of chest pain since Labor Day weekend. Workup includes negative cardiac evaluation, normal LFTs and ultrasound negative for cholelithiasis. She is also having intermittent heartburn and regurgitation and also complains of solid food dysphagia.             Informed Consent:  The risks, benefits, alternatives & imponderables which include, but are not limited to, bleeding, infection, perforation, drug reaction and potential missed lesion have been reviewed.  The potential for biopsy, lesion removal, esophageal dilation, etc. have also been discussed.  Questions have been answered.  All parties agreeable.  Please see history & physical in medical record for more information.  Medications:  Demerol 50 mg IV Versed 6 mg IV Cetacaine spray topically for oropharyngeal anesthesia  Description of procedure:  The endoscope was introduced through the mouth and advanced to the second portion of the duodenum without difficulty or limitations. The mucosal surfaces were surveyed very carefully during advancement of the scope and upon withdrawal.  Findings:  Esophagus: Mucosa of the esophagus was normal. GE junction was unremarkable with no draining or stricture formation. GEJ:  34 cm Hiatus:  36 cm Stomach:  Stomach was empty and distended very well with insufflation. Folds in the proximal stomach were normal. Examination of mucosa and gastric body was normal. Single erosion noted at antrum along with patchy erythema. No ulcer crater was present. Pyloric channel was patent. Angularis fundus and cardia were normal. Duodenum:  Normal bulbar and post  bulbar mucosa.  Therapeutic/Diagnostic Maneuvers Performed:   Esophagus dilated by passing 64 French Maloney dilator to full insertion. Esophageal mucosa was reexamined post dilation and no mucosal disruption noted.  Complications:  None  Impression: Small sliding hiatal hernia without evidence of erosive esophagitis ring or stricture formation. Single and erosion with focal erythema. These changes are possibly secondary to aspirin or NSAID use. Esophagus dilated by passing 54 French Maloney dilator but no mucosal disruption induced.  Comment; Chest pain is possibly secondary to GERD since she is is also having regurgitation and heartburn. I believe omeprazole is not working.  Recommendations:  Discontinue omeprazole. Pantoprazole 40 mg by mouth twice a day. Consider H. pylori testing unless she's had it in the past. Symptom diary until office visit in 8 weeks.  Laneya Gasaway U  08/10/2014  11:53 AM  CC: Dr. Luciana Axe, Amy H, PA-C & Dr. Rayne Du ref. provider found

## 2014-08-13 ENCOUNTER — Encounter (HOSPITAL_COMMUNITY): Payer: Self-pay | Admitting: Internal Medicine

## 2014-08-30 ENCOUNTER — Telehealth (INDEPENDENT_AMBULATORY_CARE_PROVIDER_SITE_OTHER): Payer: Self-pay | Admitting: *Deleted

## 2014-08-30 DIAGNOSIS — K219 Gastro-esophageal reflux disease without esophagitis: Secondary | ICD-10-CM

## 2014-08-30 MED ORDER — PANTOPRAZOLE SODIUM 40 MG PO TBEC: 40.0000 mg | DELAYED_RELEASE_TABLET | Freq: Two times a day (BID) | ORAL | Status: DC

## 2014-08-30 NOTE — Telephone Encounter (Signed)
Addressed.

## 2014-08-30 NOTE — Telephone Encounter (Signed)
Barbara Phillips said the medicine Pantroprazole 40 mg is written incorrectly. She was told to take 20 mg twice daily and the pharmacy has it as 40 mg once daily. She is needing the additional pill for night time. Please correct this. Her return phone number is [phone].

## 2014-09-03 ENCOUNTER — Encounter (HOSPITAL_COMMUNITY): Payer: Self-pay | Admitting: Internal Medicine

## 2014-09-07 ENCOUNTER — Telehealth (INDEPENDENT_AMBULATORY_CARE_PROVIDER_SITE_OTHER): Payer: Self-pay | Admitting: *Deleted

## 2014-09-07 NOTE — Telephone Encounter (Signed)
Pantoprazole 40 mg twice daily will require prior auth. Per Lelon Perla the patient may take Pantoprazole 40 mg my mouth daily #30 with 5 refills. She may also take Zantac OTC 150 mg by mouth at bedtime #30 5 refills. This was called to the [facility] in [place].

## 2014-09-10 NOTE — Telephone Encounter (Signed)
Correct

## 2014-10-08 ENCOUNTER — Ambulatory Visit (INDEPENDENT_AMBULATORY_CARE_PROVIDER_SITE_OTHER): Payer: 59 | Admitting: Internal Medicine

## 2014-10-11 ENCOUNTER — Ambulatory Visit (INDEPENDENT_AMBULATORY_CARE_PROVIDER_SITE_OTHER): Payer: 59 | Admitting: Internal Medicine

## 2014-10-11 ENCOUNTER — Encounter (INDEPENDENT_AMBULATORY_CARE_PROVIDER_SITE_OTHER): Payer: Self-pay | Admitting: Internal Medicine

## 2014-10-11 VITALS — BP 98/52 | HR 72 | Temp 97.9°F | Ht 61.0 in | Wt 129.0 lb

## 2014-10-11 DIAGNOSIS — K219 Gastro-esophageal reflux disease without esophagitis: Secondary | ICD-10-CM

## 2014-10-11 NOTE — Patient Instructions (Signed)
HOB up on blocks. Follow GERD diet. Zantac at night as needed.

## 2014-10-11 NOTE — Progress Notes (Signed)
Subjective:    Patient ID: Barbara Phillips, female    DOB: 1956/04/14, 58 y.o.   MRN: [id number]  HPI Here today for f/u after undergoing EGD/ED in October for chest pain and dysphagia. Cardiac workup was negative. She tells me she is doing good. She is still having some acid reflux.   Appetite is good. No weight loss. BMs are normal.  She has reduced her sugar intake.  She says she does eat hot,spicy foods maybe once a week. She has acid reflux daily. She really avoids greasy foods. She does watch what she eats. She has stopped soft drinks. She is eating more fruits and salads.     PATIENT: Barbara Phillips  Birthdate: Feb 13, 1956, 58 y.o., female Endoscopist: Dr. Rogene Houston, MD Referred By: Ms. Kassie Mends PA-C  Procedure Date: 08/10/2014  Procedure: EGD with ED.  Indications: Patient is 58 year old Caucasian female who has chronic GERD and maintain a good result. She's been having squeezing type of chest pain since Labor Day weekend. Workup includes negative cardiac evaluation, normal LFTs and ultrasound negative for cholelithiasis. She is also having intermittent heartburn and regurgitation and also complains of solid food dysphagia.   Impression: Small sliding hiatal hernia without evidence of erosive esophagitis ring or stricture formation. Single and erosion with focal erythema. These changes are possibly secondary to aspirin or NSAID use. Esophagus dilated by passing 54 French Maloney dilator but no mucosal disruption induced.  Comment; Chest pain is possibly secondary to GERD since she is is also having regurgitation and heartburn. I believe omeprazole is not working.    Review of Systems Past Medical History  Diagnosis Date  . GERD (gastroesophageal reflux disease)   . Hypercholesteremia   . Stress at home 12/11/2013    Has to oversee care of 36 year old mom who lives an hour away    Past Surgical History  Procedure Laterality Date  .  Colonoscopy  09/08/2012    Procedure: COLONOSCOPY;  Surgeon: Rogene Houston, MD;  [facility]: [facility];  Service: Endoscopy;  Laterality: N/A;  830  . Cesarean section    . Eye surgery    . Esophagogastroduodenoscopy N/A 08/10/2014    Procedure: ESOPHAGOGASTRODUODENOSCOPY (EGD);  Surgeon: Rogene Houston, MD;  Location: [facility];  Service: Endoscopy;  Laterality: N/A;  1015 - moved to 12:10 - Ann to notify pt    No Known Allergies  Current Outpatient Prescriptions on File Prior to Visit  Medication Sig Dispense Refill  . ALPRAZolam (XANAX) 0.25 MG tablet Take 1 tablet (0.25 mg total) by mouth at bedtime as needed for anxiety. 30 tablet 0  . Cholecalciferol (VITAMIN D3) 2000 UNITS capsule Take 2,000 Units by mouth daily.    . Coenzyme Q10 (CO Q 10) 100 MG CAPS Take 1 capsule by mouth daily.    . cycloSPORINE (RESTASIS) 0.05 % ophthalmic emulsion Place 1 drop into both eyes 2 (two) times daily.    Marland Kitchen ibuprofen (ADVIL,MOTRIN) 800 MG tablet Take 800 mg by mouth daily as needed. Knee pain.    . Omega-3 Fatty Acids (FISH OIL) 1000 MG CAPS Take 1 capsule by mouth daily.    . pantoprazole (PROTONIX) 40 MG tablet Take 1 tablet (40 mg total) by mouth 2 (two) times daily. (Patient taking differently: Take 40 mg by mouth daily. ) 60 tablet 3  . Polyethyl Glycol-Propyl Glycol (SYSTANE OP) Apply 1 drop to eye 2 (two) times daily.    . simvastatin (ZOCOR) 40 MG tablet  Take 40 mg by mouth every evening.    . triamcinolone (KENALOG) 0.025 % ointment Apply 1 application topically 2 (two) times daily as needed (rash).     No current facility-administered medications on file prior to visit.        Objective:   Physical Exam  Filed Vitals:   10/11/14 0924  Height: 5\' 1"  (1.549 m)  Weight: 129 lb (58.514 kg)   Alert and oriented. Skin warm and dry. Oral mucosa is moist.   . Sclera anicteric, conjunctivae is pink. Thyroid not enlarged. No cervical lymphadenopathy. Lungs clear. Heart regular rate  and rhythm.  Abdomen is soft. Bowel sounds are positive. No hepatomegaly. No abdominal masses felt. No tenderness.  No edema to lower extremities.          Assessment & Plan:  GERD. She still is having breakthru. May take Protonix am and Zantac at night. Raise HOB. OV in 1 year.  GERD diet reviewed with patient.

## 2014-10-23 ENCOUNTER — Encounter (INDEPENDENT_AMBULATORY_CARE_PROVIDER_SITE_OTHER): Payer: Self-pay

## 2014-12-17 ENCOUNTER — Other Ambulatory Visit: Payer: 59 | Admitting: Adult Health

## 2014-12-25 ENCOUNTER — Ambulatory Visit (INDEPENDENT_AMBULATORY_CARE_PROVIDER_SITE_OTHER): Payer: 59 | Admitting: Adult Health

## 2014-12-25 ENCOUNTER — Encounter: Payer: Self-pay | Admitting: Adult Health

## 2014-12-25 VITALS — BP 152/80 | HR 78 | Ht 60.5 in | Wt 128.5 lb

## 2014-12-25 DIAGNOSIS — Z01419 Encounter for gynecological examination (general) (routine) without abnormal findings: Secondary | ICD-10-CM

## 2014-12-25 DIAGNOSIS — Z1212 Encounter for screening for malignant neoplasm of rectum: Secondary | ICD-10-CM

## 2014-12-25 LAB — HEMOCCULT GUIAC POC 1CARD (OFFICE): FECAL OCCULT BLD: NEGATIVE

## 2014-12-25 NOTE — Progress Notes (Signed)
Patient ID: Barbara Phillips, female   DOB: 1956-07-08, 59 y.o.   MRN: [id number] History of Present Illness: Barbara Phillips is 59 year old white female, married in for well woman gyn exam.She had a normal pap with negative HPV.   Current Medications, Allergies, Past Medical History, Past Surgical History, Family History and Social History were reviewed in [employer] record.     Review of Systems: Patient denies any headaches, hearing loss,  blurred vision, shortness of breath, chest pain, abdominal pain, problems with bowel movements, urination, or intercourse(not having sex). No joint pain or mood swings.She is tired at times, works 6 days a week,cares for 38 year old Mom and husband is disabled.    Physical Exam:BP 152/80 mmHg  Pulse 78  Ht 5' 0.5" (1.537 m)  Wt 128 lb 8 oz (58.287 kg)  BMI 24.67 kg/m2 General:  Well developed, well nourished, no acute distress Skin:  Warm and dry Neck:  Midline trachea, normal thyroid, good ROM, no lymphadenopathy Lungs; Clear to auscultation bilaterally Breast:  No dominant palpable mass, retraction, or nipple discharge Cardiovascular: Regular rate and rhythm Abdomen:  Soft, non tender, no hepatosplenomegaly Pelvic:  External genitalia is normal in appearance, no lesions.  The vagina is normal in appearance, with loss of color, moisture and rugae.Marland Kitchen Urethra has no lesions or masses. The cervix is smooth.  Uterus is felt to be normal size, shape, and contour.  No adnexal masses or tenderness noted.Bladder is non tender, no masses felt. Rectal: Good sphincter tone, no polyps, or hemorrhoids felt.  Hemoccult negative. Extremities/musculoskeletal:  No swelling or varicosities noted, no clubbing or cyanosis Psych:  No mood changes, alert and cooperative,seems happy   Impression: Well woman gyn exam no pap    Plan: Labs tomorrow with PCP,Dr Sasser Pap and physical in 1 year Colonoscopy per GI Mammogram 3/10 and yearly

## 2014-12-25 NOTE — Patient Instructions (Signed)
Pap and physical in 1 year Mammogram yearly Labs with PCP Colonoscopy per GI

## 2015-02-11 ENCOUNTER — Encounter: Payer: Self-pay | Admitting: Adult Health

## 2015-07-22 ENCOUNTER — Encounter (INDEPENDENT_AMBULATORY_CARE_PROVIDER_SITE_OTHER): Payer: Self-pay | Admitting: *Deleted

## 2015-07-29 ENCOUNTER — Emergency Department (HOSPITAL_COMMUNITY): Payer: Worker's Compensation

## 2015-07-29 ENCOUNTER — Encounter (HOSPITAL_COMMUNITY): Payer: Self-pay | Admitting: Family Medicine

## 2015-07-29 ENCOUNTER — Emergency Department (HOSPITAL_COMMUNITY)
Admission: EM | Admit: 2015-07-29 | Discharge: 2015-07-29 | Disposition: A | Discharge status: Home / Self Care | Payer: Worker's Compensation | Attending: Emergency Medicine | Admitting: Emergency Medicine

## 2015-07-29 DIAGNOSIS — M545 Low back pain: Secondary | ICD-10-CM

## 2015-07-29 DIAGNOSIS — S50311A Abrasion of right elbow, initial encounter: Secondary | ICD-10-CM | POA: Insufficient documentation

## 2015-07-29 DIAGNOSIS — W01198A Fall on same level from slipping, tripping and stumbling with subsequent striking against other object, initial encounter: Secondary | ICD-10-CM | POA: Diagnosis not present

## 2015-07-29 DIAGNOSIS — Y9389 Activity, other specified: Secondary | ICD-10-CM | POA: Insufficient documentation

## 2015-07-29 DIAGNOSIS — Z79899 Other long term (current) drug therapy: Secondary | ICD-10-CM | POA: Insufficient documentation

## 2015-07-29 DIAGNOSIS — S8012XA Contusion of left lower leg, initial encounter: Secondary | ICD-10-CM | POA: Insufficient documentation

## 2015-07-29 DIAGNOSIS — K219 Gastro-esophageal reflux disease without esophagitis: Secondary | ICD-10-CM | POA: Insufficient documentation

## 2015-07-29 DIAGNOSIS — Y990 Civilian activity done for income or pay: Secondary | ICD-10-CM | POA: Insufficient documentation

## 2015-07-29 DIAGNOSIS — S0990XA Unspecified injury of head, initial encounter: Secondary | ICD-10-CM | POA: Diagnosis present

## 2015-07-29 DIAGNOSIS — Y9289 Other specified places as the place of occurrence of the external cause: Secondary | ICD-10-CM | POA: Diagnosis not present

## 2015-07-29 DIAGNOSIS — E780 Pure hypercholesterolemia: Secondary | ICD-10-CM | POA: Diagnosis not present

## 2015-07-29 DIAGNOSIS — Z720 Tobacco use: Secondary | ICD-10-CM | POA: Diagnosis not present

## 2015-07-29 DIAGNOSIS — S0083XA Contusion of other part of head, initial encounter: Secondary | ICD-10-CM | POA: Insufficient documentation

## 2015-07-29 DIAGNOSIS — S3992XA Unspecified injury of lower back, initial encounter: Secondary | ICD-10-CM | POA: Insufficient documentation

## 2015-07-29 DIAGNOSIS — S0291XA Unspecified fracture of skull, initial encounter for closed fracture: Secondary | ICD-10-CM

## 2015-07-29 DIAGNOSIS — T1490XA Injury, unspecified, initial encounter: Secondary | ICD-10-CM

## 2015-07-29 HISTORY — DX: Injury, unspecified, initial encounter: T14.90XA

## 2015-07-29 MED ORDER — ACETAMINOPHEN 325 MG PO TABS
650.0000 mg | ORAL_TABLET | Freq: Once | ORAL | Status: AC
  Administered 2015-07-29: 650 mg via ORAL
  Filled 2015-07-29: qty 2

## 2015-07-29 MED ORDER — IBUPROFEN 800 MG PO TABS
800.0000 mg | ORAL_TABLET | Freq: Once | ORAL | Status: AC
  Administered 2015-07-29: 800 mg via ORAL
  Filled 2015-07-29: qty 1

## 2015-07-29 MED ORDER — HYDROCODONE-ACETAMINOPHEN 5-325 MG PO TABS: 1.0000 | ORAL_TABLET | ORAL | Status: DC | PRN
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
## 2015-07-29 NOTE — ED Notes (Signed)
PA back at the bedside.  

## 2015-07-29 NOTE — ED Provider Notes (Signed)
CSN: [id number]     Arrival date & time 07/29/15  1853 History   First MD Initiated Contact with Patient 07/29/15 1929     Chief Complaint  Patient presents with  . Head Injury  . Back Pain  . Fall   HPI  Barbara Phillips is a 59 year old female presenting after a fall. Pt states she was at work when one of the "buggy machines" ran into her and knocked her backwards. She fell onto her buttocks and then hit her head on the concrete floor. No LOC. She is now endorsing headache, tailbone and left lower leg pain. Pt states she hit the back of her head on the floor and now has a "knot" there that is throbbing and tender. Denies open wounds to the back of the head. She is also complaining of severe, stabbing tailbone pain that is exacerbated by sitting. Pain is relieved if she rolls to her side. Pt is also complaining of left anterior lower leg pain. She says that it is tender when she touches it. The pain is not over the ankle joint. She also reports that she had right shoulder pain and jaw pain at the time of the accident but this has since resolved. She was ambulating after the accident. She has not taken anything for pain control. Denies fevers, chills, confusion, lethargy, amnesia, blurred vision, vision loss, neck pain, chest pain, SOB, abdominal pain, nausea, vomiting or any other injuries sustained in the accident.    Past Medical History  Diagnosis Date  . GERD (gastroesophageal reflux disease)   . Hypercholesteremia   . Stress at home 12/11/2013    Has to oversee care of 27 year old mom who lives an hour away   Past Surgical History  Procedure Laterality Date  . Colonoscopy  09/08/2012    Procedure: COLONOSCOPY;  Surgeon: Rogene Houston, MD;  Location: [facility];  Service: Endoscopy;  Laterality: N/A;  830  . Cesarean section    . Eye surgery    . Esophagogastroduodenoscopy N/A 08/10/2014    Procedure: ESOPHAGOGASTRODUODENOSCOPY (EGD);  Surgeon: Rogene Houston, MD;  Location: [facility];  Service: Endoscopy;  Laterality: N/A;  1015 - moved to 12:10 - Ann to notify pt   Family History  Problem Relation Age of Onset  . Colon cancer Father   . Stroke Father   . Thyroid disease Mother   . Hypertension Mother   . Diabetes Sister   . Hyperlipidemia Sister   . Hypertension Sister   . Hyperlipidemia Son   . Heart disease Maternal Aunt   . Hypertension Maternal Aunt   . Hyperlipidemia Maternal Aunt   . Diabetes Maternal Aunt   . Hypertension Maternal Uncle   . Hyperlipidemia Maternal Uncle   . Heart disease Maternal Uncle   . Diabetes Maternal Uncle   . Diabetes Paternal Aunt   . Heart disease Paternal Aunt   . Hyperlipidemia Paternal Aunt   . Hypertension Paternal Aunt   . Diabetes Paternal Uncle   . Heart disease Paternal Uncle   . Hyperlipidemia Paternal Uncle   . Hypertension Paternal Uncle   . Heart attack Maternal Grandmother   . Cancer Paternal Grandfather    Social History  Substance Use Topics  . Smoking status: Current Every Day Smoker -- 0.50 packs/day for 40 years    Types: Cigarettes  . Smokeless tobacco: Never Used     Comment: 1/2 to 1 pack a day x 41 yrs.   Marland Kitchen  Alcohol Use: Yes     Comment: occasionally   OB History    Gravida Para Term Preterm AB TAB SAB Ectopic Multiple Living   1 1        1      Review of Systems  Constitutional: Negative for fever and chills.  HENT: Negative for rhinorrhea.   Eyes: Negative for visual disturbance.  Respiratory: Negative for shortness of breath.   Cardiovascular: Negative for chest pain.  Gastrointestinal: Negative for nausea, vomiting and abdominal pain.  Musculoskeletal: Positive for myalgias and arthralgias. Negative for back pain, joint swelling and neck pain.  Skin: Negative for wound.  Neurological: Positive for headaches. Negative for dizziness, syncope, weakness, light-headedness and numbness.  Hematological: Does not bruise/bleed easily.  Psychiatric/Behavioral: Negative for confusion.       Allergies  Review of patient's allergies indicates no known allergies.  Home Medications   Prior to Admission medications   Medication Sig Start Date End Date Taking? Authorizing Provider  Cholecalciferol (VITAMIN D3) 2000 UNITS capsule Take 2,000 Units by mouth daily.    Historical Provider, MD  Coenzyme Q10 (CO Q 10) 100 MG CAPS Take 1 capsule by mouth daily.    Historical Provider, MD  cycloSPORINE (RESTASIS) 0.05 % ophthalmic emulsion Place 1 drop into both eyes 2 (two) times daily.    Historical Provider, MD  HYDROcodone-acetaminophen (NORCO/VICODIN) 5-325 MG per tablet Take 1-2 tablets by mouth every 4 (four) hours as needed. 07/29/15   Barbara Barrett, Barbara Phillips  ibuprofen (ADVIL,MOTRIN) 800 MG tablet Take 800 mg by mouth daily as needed.     Historical Provider, MD  Omega-3 Fatty Acids (FISH OIL) 1000 MG CAPS Take 1 capsule by mouth daily.    Historical Provider, MD  pantoprazole (PROTONIX) 40 MG tablet Take 1 tablet (40 mg total) by mouth 2 (two) times daily. Patient taking differently: Take 40 mg by mouth daily.  08/30/14   Butch Penny, NP  Polyethyl Glycol-Propyl Glycol (SYSTANE OP) Apply 1 drop to eye 2 (two) times daily.    Historical Provider, MD  simvastatin (ZOCOR) 40 MG tablet Take 40 mg by mouth every evening.    Historical Provider, MD  triamcinolone (KENALOG) 0.025 % ointment Apply 1 application topically 2 (two) times daily as needed (rash). 12/11/13   Estill Dooms, NP   BP 147/82 mmHg  Pulse 70  Temp(Src) 98.1 F (36.7 C)  Resp 16  SpO2 97% Physical Exam  Constitutional: She is oriented to person, place, and time. She appears well-developed and well-nourished. No distress.  HENT:  Head: Normocephalic and atraumatic.  Mouth/Throat: Oropharynx is clear and moist. No oropharyngeal exudate.  Hematoma over right occiput.  Eyes: Conjunctivae and EOM are normal. Pupils are equal, round, and reactive to light. Right eye exhibits no discharge. Left eye exhibits no  discharge. No scleral icterus.  Neck: Normal range of motion. Neck supple.  Cardiovascular: Normal rate, regular rhythm and normal heart sounds.   Pulmonary/Chest: Effort normal and breath sounds normal. No respiratory distress. She has no wheezes. She has no rales.  Abdominal: Soft. She exhibits no distension. There is no tenderness. There is no rebound and no guarding.  Musculoskeletal: Normal range of motion.  FROM of lumbar spine, no tenderness over midline spine, no stepoffs or bony deformities. TTP over tailbone. Pt moves extremities spontaneously and with no obvious deformity. Walks with a steady gait  Neurological: She is alert and oriented to person, place, and time. No cranial nerve deficit. Coordination normal.  Cranial  nerves 3-12 intact. Motor 5/5 BUE and BLE. Sensation to light touch intact throughout. Normal finger to nose. No pronator drift. Walks with a steady gait.  Skin: Skin is warm and dry.  Area of ecchymosis over left anterior distal tibia. No open wounds. Small abrasion to right elbow.   Psychiatric: She has a normal mood and affect. Her behavior is normal. Cognition and memory are not impaired.  Nursing note and vitals reviewed.   ED Course  Procedures (including critical care time) Labs Review Labs Reviewed - No data to display  Imaging Review Dg Lumbar Spine Complete  07/29/2015   CLINICAL DATA:  Fall today.  Tail bone pain  EXAM: LUMBAR SPINE - COMPLETE 4+ VIEW  COMPARISON:  None.  FINDINGS: Mild levoscoliosis. Normal alignment and no fracture. No pars defect. Disc spaces are maintained.  Mild atherosclerotic calcification in the abdominal aorta.  IMPRESSION: No acute bony abnormality.   Electronically Signed   By: Franchot Gallo M.D.   On: 07/29/2015 21:11   Ct Head Wo Contrast  07/29/2015   CLINICAL DATA:  Hit with a forklift. Knot on back of head. Right shoulder pain. Initial encounter.  EXAM: CT HEAD WITHOUT CONTRAST  CT CERVICAL SPINE WITHOUT CONTRAST   TECHNIQUE: Multidetector CT imaging of the head and cervical spine was performed following the standard protocol without intravenous contrast. Multiplanar CT image reconstructions of the cervical spine were also generated.  COMPARISON:  Head CT 08/30/2011.  Cervical spine MRI 08/28/2011.  FINDINGS: CT HEAD FINDINGS  Skull and Sinuses:Nondepressed right parasagittal occipital bone fracture with overlying scalp hematoma.  Smoothly contoured predominant low-density mass in the left maxillary antrum, likely retention cyst.  Orbits: No acute abnormality.  Brain: No evidence hemorrhage, hydrocephalus, or mass lesion/mass effect. No acute or remote infarct. Intracranial calcified atherosclerosis seen in the carotid siphons.  CT CERVICAL SPINE FINDINGS  No evidence of cervical spine fracture or traumatic malalignment. No gross cervical canal hematoma or prevertebral edema.  Disc and facet degeneration is advanced, especially for age. Disc disease is particularly notable from C4-5 to C6-7 with bilateral uncovertebral spurs causing foraminal stenosis. Posterior disc osteophyte complexes at C5-6 and C6-7 cause at least moderate canal stenosis, with presumed cord flattening. Facet arthropathy is more advanced on the right in the mid cervical levels.  IMPRESSION: 1. No evidence of intracranial or cervical spine injury. 2. Nondepressed right occipital bone fracture. 3. Advanced facet and disc degeneration with at least moderate canal stenosis at C5-6 and C6-7.   Electronically Signed   By: Monte Fantasia M.D.   On: 07/29/2015 22:05   Ct Cervical Spine Wo Contrast  07/29/2015   CLINICAL DATA:  Hit with a forklift. Knot on back of head. Right shoulder pain. Initial encounter.  EXAM: CT HEAD WITHOUT CONTRAST  CT CERVICAL SPINE WITHOUT CONTRAST  TECHNIQUE: Multidetector CT imaging of the head and cervical spine was performed following the standard protocol without intravenous contrast. Multiplanar CT image reconstructions of the  cervical spine were also generated.  COMPARISON:  Head CT 08/30/2011.  Cervical spine MRI 08/28/2011.  FINDINGS: CT HEAD FINDINGS  Skull and Sinuses:Nondepressed right parasagittal occipital bone fracture with overlying scalp hematoma.  Smoothly contoured predominant low-density mass in the left maxillary antrum, likely retention cyst.  Orbits: No acute abnormality.  Brain: No evidence hemorrhage, hydrocephalus, or mass lesion/mass effect. No acute or remote infarct. Intracranial calcified atherosclerosis seen in the carotid siphons.  CT CERVICAL SPINE FINDINGS  No evidence of cervical spine fracture or  traumatic malalignment. No gross cervical canal hematoma or prevertebral edema.  Disc and facet degeneration is advanced, especially for age. Disc disease is particularly notable from C4-5 to C6-7 with bilateral uncovertebral spurs causing foraminal stenosis. Posterior disc osteophyte complexes at C5-6 and C6-7 cause at least moderate canal stenosis, with presumed cord flattening. Facet arthropathy is more advanced on the right in the mid cervical levels.  IMPRESSION: 1. No evidence of intracranial or cervical spine injury. 2. Nondepressed right occipital bone fracture. 3. Advanced facet and disc degeneration with at least moderate canal stenosis at C5-6 and C6-7.   Electronically Signed   By: Monte Fantasia M.D.   On: 07/29/2015 22:05   I have personally reviewed and evaluated these images and lab results as part of my medical decision-making.   EKG Interpretation None      MDM   Final diagnoses:  Skull fracture, closed, initial encounter  Low back pain without sciatica, unspecified back pain laterality   Pt presenting after a fall at work in which she landed on her buttocks and hit her head on a concrete floor without LOC. Complaining of headache and tailbone pain as described above. VSS. Pt nontoxic appearing and answer questions appropriately. Neurologically intact and walking with steady gait.  Hematoma noted to right occiput. CT head shows non-depressed right occipital bone fracture. Consulted neurosurgery who recommends pt be discharged with return precautions but no follow up necessary. Discussed return precautions at length with pt and her husband and they endorsed understanding. Advised pt to use a donut pillow to sit on for tailbone pain. Will discharge with pain medication. Pt stable for discharge.     Lahoma Crocker Barrett, Barbara Phillips 07/29/15 2349  Nat Christen, MD 07/30/15 [phone]

## 2015-07-29 NOTE — Discharge Instructions (Signed)
Head Injury °You have received a head injury. It does not appear serious at this time. Headaches and vomiting are common following head injury. It should be easy to awaken from sleeping. Sometimes it is necessary for you to stay in the emergency department for a while for observation. Sometimes admission to the hospital may be needed. After injuries such as yours, most problems occur within the first 24 hours, but side effects may occur up to 7-10 days after the injury. It is important for you to carefully monitor your condition and contact your health care provider or seek immediate medical care if there is a change in your condition. °WHAT ARE THE TYPES OF HEAD INJURIES? °Head injuries can be as minor as a bump. Some head injuries can be more severe. More severe head injuries include: °· A jarring injury to the brain (concussion). °· A bruise of the brain (contusion). This mean there is bleeding in the brain that can cause swelling. °· A cracked skull (skull fracture). °· Bleeding in the brain that collects, clots, and forms a bump (hematoma). °WHAT CAUSES A HEAD INJURY? °A serious head injury is most likely to happen to someone who is in a car wreck and is not wearing a seat belt. Other causes of major head injuries include bicycle or motorcycle accidents, sports injuries, and falls. °HOW ARE HEAD INJURIES DIAGNOSED? °A complete history of the event leading to the injury and your current symptoms will be helpful in diagnosing head injuries. Many times, pictures of the brain, such as CT or MRI are needed to see the extent of the injury. Often, an overnight hospital stay is necessary for observation.  °WHEN SHOULD I SEEK IMMEDIATE MEDICAL CARE?  °You should get help right away if: °· You have confusion or drowsiness. °· You feel sick to your stomach (nauseous) or have continued, forceful vomiting. °· You have dizziness or unsteadiness that is getting worse. °· You have severe, continued headaches not relieved by  medicine. Only take over-the-counter or prescription medicines for pain, fever, or discomfort as directed by your health care provider. °· You do not have normal function of the arms or legs or are unable to walk. °· You notice changes in the black spots in the center of the colored part of your eye (pupil). °· You have a clear or bloody fluid coming from your nose or ears. °· You have a loss of vision. °During the next 24 hours after the injury, you must stay with someone who can watch you for the warning signs. This person should contact local emergency services (911 in the [place] if you have seizures, you become unconscious, or you are unable to wake up. °HOW CAN I PREVENT A HEAD INJURY IN THE FUTURE? °The most important factor for preventing major head injuries is avoiding motor vehicle accidents.  To minimize the potential for damage to your head, it is crucial to wear seat belts while riding in motor vehicles. Wearing helmets while bike riding and playing collision sports (like football) is also helpful. Also, avoiding dangerous activities around the house will further help reduce your risk of head injury.  °WHEN CAN I RETURN TO NORMAL ACTIVITIES AND ATHLETICS? °You should be reevaluated by your health care provider before returning to these activities. If you have any of the following symptoms, you should not return to activities or contact sports until 1 week after the symptoms have stopped: °· Persistent headache. °· Dizziness or vertigo. °· Poor attention and concentration. °· Confusion. °·   Memory problems.  Nausea or vomiting.  Fatigue or tire easily.  Irritability.  Intolerant of bright lights or loud noises.  Anxiety or depression.  Disturbed sleep. MAKE SURE YOU:   Understand these instructions.  Will watch your condition.  Will get help right away if you are not doing well or get worse. Document Released: 10/19/2005 Document Revised: 10/24/2013 Document Reviewed:  06/26/2013 [facility] Patient Information 2015 [place], [place]. This information is not intended to replace advice given to you by your health care provider. Make sure you discuss any questions you have with your health care provider.  Musculoskeletal Pain Musculoskeletal pain is muscle and boney aches and pains. These pains can occur in any part of the body. Your caregiver may treat you without knowing the cause of the pain. They may treat you if blood or urine tests, X-rays, and other tests were normal.  CAUSES There is often not a definite cause or reason for these pains. These pains may be caused by a type of germ (virus). The discomfort may also come from overuse. Overuse includes working out too hard when your body is not fit. Boney aches also come from weather changes. Bone is sensitive to atmospheric pressure changes. HOME CARE INSTRUCTIONS   Ask when your test results will be ready. Make sure you get your test results.  Only take over-the-counter or prescription medicines for pain, discomfort, or fever as directed by your caregiver. If you were given medications for your condition, do not drive, operate machinery or power tools, or sign legal documents for 24 hours. Do not drink alcohol. Do not take sleeping pills or other medications that may interfere with treatment.  Continue all activities unless the activities cause more pain. When the pain lessens, slowly resume normal activities. Gradually increase the intensity and duration of the activities or exercise.  During periods of severe pain, bed rest may be helpful. Lay or sit in any position that is comfortable.  Putting ice on the injured area.  Put ice in a bag.  Place a towel between your skin and the bag.  Leave the ice on for 15 to 20 minutes, 3 to 4 times a day.  Follow up with your caregiver for continued problems and no reason can be found for the pain. If the pain becomes worse or does not go away, it may be necessary to  repeat tests or do additional testing. Your caregiver may need to look further for a possible cause. SEEK IMMEDIATE MEDICAL CARE IF:  You have pain that is getting worse and is not relieved by medications.  You develop chest pain that is associated with shortness or breath, sweating, feeling sick to your stomach (nauseous), or throw up (vomit).  Your pain becomes localized to the abdomen.  You develop any new symptoms that seem different or that concern you. MAKE SURE YOU:   Understand these instructions.  Will watch your condition.  Will get help right away if you are not doing well or get worse. Document Released: 10/19/2005 Document Revised: 01/11/2012 Document Reviewed: 06/23/2013 [facility] Patient Information 2015 [place], [place]. This information is not intended to replace advice given to you by your health care provider. Make sure you discuss any questions you have with your health care provider.  Skull Fracture, Uncomplicated, Adult You have a skull fracture. This happens when one of the bones of your head cracks or breaks. Your injury does not appear serious at this time and we feel you can be observed safely at  home. An injury to the head that causes a skull fracture may also cause a concussion. With a concussion you may be knocked out for a brief moment (loss of consciousness). A concussion is the mildest form of traumatic brain injury. The symptoms of a concussion are short-lived and resolve on their own. The most common symptoms are confusion and forgetting what happened (amnesia). SYMPTOMS These minor symptoms may be experienced after discharge:  Memory difficulties.  Dizziness.  Headaches.  Hearing difficulties.  Vertigo or trouble with balance.  Depression.  Tiredness.  Difficulty with concentration.  Nausea.  Vomiting. A bruise on the brain (concussion) requires a few days for recovery the same as a bruise elsewhere on your body. It is common for people  with injuries such as yours to experience such problems. Usually these problems disappear without medical care. If symptoms remain for more than a few days, notify your caregiver. See your caregiver sooner if symptoms are becoming worse rather than better. HOME CARE INSTRUCTIONS   During the next 24 hours you should stay with an adult who can watch you for the above warning signs.  This person should wake you up every 02-03 hours to check on your condition, noting any of the above signs or symptoms. Problems which are getting worse mean you should call or return immediately to the facility where you were just seen, or to the nearest emergency department. In case of emergency or unconsciousness, call for local emergency medical help.  You should take clear liquids for the rest of the day and then resume your regular diet.  You should not take sedatives or alcoholic beverages for 48 hours after discharge.  After injuries such as yours, most serious problems happen within the first 24 hours.  If x-rays or other testing were done, make sure you know how you are to get those results. It is your responsibility to call back for results when your caregiver suggests. Do not assume everything is fine if your caregiver has not been able to reach you.  Skull fractures usually do not need follow up x-rays. These fractures are not like broken arms. The position of the skull stays the same as when it was broken and usually heals without problems.  If you have a concussion be aware that symptoms may last up to a week or longer.  It is very important to keep any follow-up appointments after a head injury.  It is unlikely that serious side effects will occur. If they do occur it is usually soon after the accident but may occur as long as a week after the accident or injury. SEEK IMMEDIATE MEDICAL CARE IF:   Confusion or drowsiness develops; children frequently become drowsy after any type of trauma or  injury.  A person cannot arouse the injured person.  You feel sick to your stomach (nausea) or persistent, forceful vomiting (projectile in nature).  Rapid back and forth movement of the eyes. This is called nystagmus.  Convulsions, seizures, or unconsciousness.  Severe persistent headaches not relieved by medication. Only take over-the-counter or prescription medicines for pain, discomfort, or fever as directed by your caregiver. (Do not take aspirin as this weakens blood clotting abilities).  Inability to use arms or legs appropriately.  Changes in pupil sizes.  Clear or bloody discharge from nose or ears. Document Released: 08/19/2004 Document Revised: 01/11/2012 Document Reviewed: 12/25/2008 [facility] Patient Information 2015 [place], [place]. This information is not intended to replace advice given to you by your health care  provider. Make sure you discuss any questions you have with your health care provider. ° °

## 2015-07-30 ENCOUNTER — Emergency Department (HOSPITAL_BASED_OUTPATIENT_CLINIC_OR_DEPARTMENT_OTHER): Payer: Worker's Compensation

## 2015-07-30 ENCOUNTER — Encounter (HOSPITAL_BASED_OUTPATIENT_CLINIC_OR_DEPARTMENT_OTHER): Payer: Self-pay | Admitting: Emergency Medicine

## 2015-07-30 ENCOUNTER — Emergency Department (HOSPITAL_BASED_OUTPATIENT_CLINIC_OR_DEPARTMENT_OTHER)
Admission: EM | Admit: 2015-07-30 | Discharge: 2015-07-30 | Disposition: A | Discharge status: Home / Self Care | Payer: Worker's Compensation | Attending: Emergency Medicine | Admitting: Emergency Medicine

## 2015-07-30 DIAGNOSIS — Z720 Tobacco use: Secondary | ICD-10-CM | POA: Diagnosis not present

## 2015-07-30 DIAGNOSIS — Z79899 Other long term (current) drug therapy: Secondary | ICD-10-CM | POA: Diagnosis not present

## 2015-07-30 DIAGNOSIS — E780 Pure hypercholesterolemia: Secondary | ICD-10-CM | POA: Insufficient documentation

## 2015-07-30 DIAGNOSIS — K219 Gastro-esophageal reflux disease without esophagitis: Secondary | ICD-10-CM | POA: Diagnosis not present

## 2015-07-30 DIAGNOSIS — M25512 Pain in left shoulder: Secondary | ICD-10-CM | POA: Diagnosis present

## 2015-07-30 DIAGNOSIS — Z87828 Personal history of other (healed) physical injury and trauma: Secondary | ICD-10-CM | POA: Insufficient documentation

## 2015-07-30 NOTE — Discharge Instructions (Signed)
Continue to take Motrin for pain control and Vicodin as needed for any breakthrough pain. You can ice your injuries at rest to decrease swelling. Return without fail for worsening symptoms including confusion, vomiting unable to keep down food or fluids, new numbness or weakness, new vision or speech changes, worsening headache or pain despite use of pain medications, or any other symptoms concerning to you. Shoulder Pain The shoulder is the joint that connects your arm to your body. Muscles and band-like tissues that connect bones to muscles (tendons) hold the joint together. Shoulder pain is felt if an injury or medical problem affects one or more parts of the shoulder. HOME CARE   Put ice on the sore area.  Put ice in a plastic bag.  Place a towel between your skin and the bag.  Leave the ice on for 15-20 minutes, 03-04 times a day for the first 2 days.  Stop using cold packs if they do not help with the pain.  If you were given something to keep your shoulder from moving (sling; shoulder immobilizer), wear it as told. Only take it off to shower or bathe.  Move your arm as little as possible, but keep your hand moving to prevent puffiness (swelling).  Squeeze a soft ball or foam pad as much as possible to help prevent swelling.  Take medicine as told by your doctor. GET HELP IF:  You have progressing new pain in your arm, hand, or fingers.  Your hand or fingers get cold.  Your medicine does not help lessen your pain. GET HELP RIGHT AWAY IF:   Your arm, hand, or fingers are numb or tingling.  Your arm, hand, or fingers are puffy (swollen), painful, or turn white or blue. MAKE SURE YOU:   Understand these instructions.  Will watch your condition.  Will get help right away if you are not doing well or get worse. Document Released: 04/06/2008 Document Revised: 03/05/2014 Document Reviewed: 05/02/2012 [facility] Patient Information 2015 [place], [place]. This information is not  intended to replace advice given to you by your health care provider. Make sure you discuss any questions you have with your health care provider.

## 2015-07-30 NOTE — ED Notes (Signed)
Patient states that she is starting to have pain to her left shoulder with bruising noted, and a bruise to her left lower leg. The patient reports that these are the changes today, however also notes that she has pain to all the same areas that she did last night.

## 2015-07-30 NOTE — ED Notes (Signed)
MD at bedside. 
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
## 2015-08-05 ENCOUNTER — Emergency Department (HOSPITAL_BASED_OUTPATIENT_CLINIC_OR_DEPARTMENT_OTHER)
Admission: EM | Admit: 2015-08-05 | Discharge: 2015-08-05 | Disposition: A | Discharge status: Home / Self Care | Payer: Worker's Compensation | Attending: Emergency Medicine | Admitting: Emergency Medicine

## 2015-08-05 ENCOUNTER — Encounter (HOSPITAL_BASED_OUTPATIENT_CLINIC_OR_DEPARTMENT_OTHER): Payer: Self-pay

## 2015-08-05 DIAGNOSIS — R200 Anesthesia of skin: Secondary | ICD-10-CM | POA: Diagnosis present

## 2015-08-05 DIAGNOSIS — E7800 Pure hypercholesterolemia, unspecified: Secondary | ICD-10-CM | POA: Diagnosis not present

## 2015-08-05 DIAGNOSIS — F0781 Postconcussional syndrome: Secondary | ICD-10-CM | POA: Insufficient documentation

## 2015-08-05 DIAGNOSIS — K219 Gastro-esophageal reflux disease without esophagitis: Secondary | ICD-10-CM | POA: Diagnosis not present

## 2015-08-05 DIAGNOSIS — Z79899 Other long term (current) drug therapy: Secondary | ICD-10-CM | POA: Insufficient documentation

## 2015-08-05 DIAGNOSIS — M26621 Arthralgia of right temporomandibular joint: Secondary | ICD-10-CM | POA: Diagnosis not present

## 2015-08-05 DIAGNOSIS — Z720 Tobacco use: Secondary | ICD-10-CM | POA: Insufficient documentation

## 2015-08-05 NOTE — Discharge Instructions (Signed)
Please monitor for new or worsening signs or symptoms, return immediately if any present. Please reviewed-information. Please follow-up with neurologist for further evaluation and management. Please contact her primary care provider inform them of your visit today.
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
## 2015-08-15 ENCOUNTER — Emergency Department (HOSPITAL_BASED_OUTPATIENT_CLINIC_OR_DEPARTMENT_OTHER)
Admission: EM | Admit: 2015-08-15 | Discharge: 2015-08-15 | Disposition: A | Discharge status: Home / Self Care | Payer: Worker's Compensation | Attending: Emergency Medicine | Admitting: Emergency Medicine

## 2015-08-15 ENCOUNTER — Encounter (HOSPITAL_BASED_OUTPATIENT_CLINIC_OR_DEPARTMENT_OTHER): Payer: Self-pay | Admitting: Emergency Medicine

## 2015-08-15 DIAGNOSIS — G44309 Post-traumatic headache, unspecified, not intractable: Secondary | ICD-10-CM | POA: Insufficient documentation

## 2015-08-15 DIAGNOSIS — K219 Gastro-esophageal reflux disease without esophagitis: Secondary | ICD-10-CM | POA: Insufficient documentation

## 2015-08-15 DIAGNOSIS — E7800 Pure hypercholesterolemia, unspecified: Secondary | ICD-10-CM | POA: Diagnosis not present

## 2015-08-15 DIAGNOSIS — Z79899 Other long term (current) drug therapy: Secondary | ICD-10-CM | POA: Insufficient documentation

## 2015-08-15 DIAGNOSIS — Z720 Tobacco use: Secondary | ICD-10-CM | POA: Insufficient documentation

## 2015-08-15 DIAGNOSIS — F0781 Postconcussional syndrome: Secondary | ICD-10-CM | POA: Diagnosis not present

## 2015-08-15 DIAGNOSIS — R42 Dizziness and giddiness: Secondary | ICD-10-CM | POA: Diagnosis not present

## 2015-08-15 HISTORY — DX: Postconcussional syndrome: F07.81

## 2015-08-15 MED ORDER — KETOROLAC TROMETHAMINE 30 MG/ML IJ SOLN
30.0000 mg | Freq: Once | INTRAMUSCULAR | Status: AC
  Administered 2015-08-15: 30 mg via INTRAVENOUS
  Filled 2015-08-15: qty 1

## 2015-08-15 MED ORDER — DIPHENHYDRAMINE HCL 50 MG/ML IJ SOLN
25.0000 mg | Freq: Once | INTRAMUSCULAR | Status: AC
  Administered 2015-08-15: 25 mg via INTRAVENOUS
  Filled 2015-08-15: qty 1

## 2015-08-15 MED ORDER — PROCHLORPERAZINE EDISYLATE 5 MG/ML IJ SOLN
10.0000 mg | Freq: Once | INTRAMUSCULAR | Status: AC
  Administered 2015-08-15: 10 mg via INTRAVENOUS
  Filled 2015-08-15: qty 2

## 2015-08-15 MED ORDER — DEXAMETHASONE SODIUM PHOSPHATE 10 MG/ML IJ SOLN
10.0000 mg | Freq: Once | INTRAMUSCULAR | Status: AC
  Administered 2015-08-15: 10 mg via INTRAVENOUS
  Filled 2015-08-15: qty 1

## 2015-08-15 MED ORDER — SODIUM CHLORIDE 0.9 % IV BOLUS (SEPSIS)
1000.0000 mL | Freq: Once | INTRAVENOUS | Status: AC
  Administered 2015-08-15: 1000 mL via INTRAVENOUS

## 2015-08-15 NOTE — ED Notes (Addendum)
[facility]-pt's HR manager states that pt is awaiting referral appt call from Neuro-Dr Dennison to be shared with EDP-also states that company has light duty for pt if able to RTW

## 2015-08-15 NOTE — ED Provider Notes (Signed)
CSN: [id number]     Arrival date & time 08/15/15  1345 History   First MD Initiated Contact with Patient 08/15/15 1459     Chief Complaint  Patient presents with  . Headache     (Consider location/radiation/quality/duration/timing/severity/associated sxs/prior Treatment) Patient is a 59 y.o. female presenting with headaches. The history is provided by the patient.  Headache Pain location:  Occipital Quality:  Sharp Radiates to:  Does not radiate Severity currently:  10/10 Severity at highest:  10/10 Onset quality:  Gradual Duration:  1 day Timing:  Constant Progression:  Worsening Chronicity:  Recurrent Similar to prior headaches: yes   Relieved by:  Nothing Worsened by:  Nothing Ineffective treatments:  None tried Associated symptoms: no congestion, no diarrhea, no dizziness, no fever, no myalgias, no nausea and no vomiting     59 yo F with a chief complaint of headaches. Is been going on since the patient had an accident at the beginning of September. Patient has been back at work and doing her normal activities. These have been causing her to have recurrent symptomatology. Patient has had some mild paresthesias periorally as well as having some flashing lights prior headaches occurring. Patient denies recurrent injury. Patient has not seen anyone else for this other than the emergency department as she feels that he'll not be compensated by her Workmen's Comp.  Past Medical History  Diagnosis Date  . GERD (gastroesophageal reflux disease)   . Hypercholesteremia   . Stress at home 12/11/2013    Has to oversee care of 38 year old mom who lives an hour away  . Post concussive syndrome    Past Surgical History  Procedure Laterality Date  . Colonoscopy  09/08/2012    Procedure: COLONOSCOPY;  Surgeon: Rogene Houston, MD;  [facility];  Service: Endoscopy;  Laterality: N/A;  830  . Cesarean section    . Eye surgery    . Esophagogastroduodenoscopy N/A 08/10/2014   Procedure: ESOPHAGOGASTRODUODENOSCOPY (EGD);  Surgeon: Rogene Houston, MD;  Location: [facility];  Service: Endoscopy;  Laterality: N/A;  1015 - moved to 12:10 - Ann to notify pt   Family History  Problem Relation Age of Onset  . Colon cancer Father   . Stroke Father   . Thyroid disease Mother   . Hypertension Mother   . Diabetes Sister   . Hyperlipidemia Sister   . Hypertension Sister   . Hyperlipidemia Son   . Heart disease Maternal Aunt   . Hypertension Maternal Aunt   . Hyperlipidemia Maternal Aunt   . Diabetes Maternal Aunt   . Hypertension Maternal Uncle   . Hyperlipidemia Maternal Uncle   . Heart disease Maternal Uncle   . Diabetes Maternal Uncle   . Diabetes Paternal Aunt   . Heart disease Paternal Aunt   . Hyperlipidemia Paternal Aunt   . Hypertension Paternal Aunt   . Diabetes Paternal Uncle   . Heart disease Paternal Uncle   . Hyperlipidemia Paternal Uncle   . Hypertension Paternal Uncle   . Heart attack Maternal Grandmother   . Cancer Paternal Grandfather    Social History  Substance Use Topics  . Smoking status: Current Every Day Smoker -- 0.50 packs/day for 40 years    Types: Cigarettes  . Smokeless tobacco: Never Used     Comment: 1/2 to 1 pack a day x 41 yrs.   . Alcohol Use: Yes     Comment: occasionally   OB History    [place]  Para Term Preterm AB TAB SAB Ectopic Multiple Living   1 1        1      Review of Systems  Constitutional: Negative for fever and chills.  HENT: Negative for congestion and rhinorrhea.   Eyes: Negative for redness and visual disturbance.  Respiratory: Negative for shortness of breath and wheezing.   Cardiovascular: Negative for chest pain and palpitations.  Gastrointestinal: Negative for nausea, vomiting and diarrhea.  Genitourinary: Negative for dysuria and urgency.  Musculoskeletal: Negative for myalgias and arthralgias.  Skin: Negative for pallor and wound.  Neurological: Positive for light-headedness and  headaches. Negative for dizziness.      Allergies  Review of patient's allergies indicates no known allergies.  Home Medications   Prior to Admission medications   Medication Sig Start Date End Date Taking? Authorizing Provider  ALPRAZolam Duanne Moron) 0.5 MG tablet Take 0.5 mg by mouth at bedtime as needed for anxiety. Has prescription for .25 mg and .50mg  tablets    Historical Provider, MD  Cholecalciferol (VITAMIN D3) 2000 UNITS capsule Take 2,000 Units by mouth daily.    Historical Provider, MD  Coenzyme Q10 (CO Q 10) 100 MG CAPS Take 1 capsule by mouth daily.    Historical Provider, MD  cycloSPORINE (RESTASIS) 0.05 % ophthalmic emulsion Place 1 drop into both eyes 2 (two) times daily.    Historical Provider, MD  HYDROcodone-acetaminophen (NORCO/VICODIN) 5-325 MG per tablet Take 1-2 tablets by mouth every 4 (four) hours as needed. 07/29/15   Stevi Barrett, PA-C  ibuprofen (ADVIL,MOTRIN) 800 MG tablet Take 800 mg by mouth daily as needed.     Historical Provider, MD  Omega-3 Fatty Acids (FISH OIL) 1000 MG CAPS Take 1 capsule by mouth daily.    Historical Provider, MD  pantoprazole (PROTONIX) 40 MG tablet Take 1 tablet (40 mg total) by mouth 2 (two) times daily. Patient taking differently: Take 40 mg by mouth daily.  08/30/14   Butch Penny, NP  Polyethyl Glycol-Propyl Glycol (SYSTANE OP) Apply 1 drop to eye 2 (two) times daily.    Historical Provider, MD  simvastatin (ZOCOR) 40 MG tablet Take 40 mg by mouth every evening.    Historical Provider, MD  triamcinolone (KENALOG) 0.025 % ointment Apply 1 application topically 2 (two) times daily as needed (rash). 12/11/13   Estill Dooms, NP   BP 143/68 mmHg  Pulse 74  Temp(Src) 98.1 F (36.7 C) (Oral)  Resp 18  Ht 5\' 1"  (1.549 m)  Wt 122 lb (55.339 kg)  BMI 23.06 kg/m2  SpO2 96% Physical Exam  Constitutional: She is oriented to person, place, and time. She appears well-developed and well-nourished. No distress.  HENT:  Head:  Normocephalic and atraumatic.  Eyes: EOM are normal. Pupils are equal, round, and reactive to light.  Neck: Normal range of motion. Neck supple.  Cardiovascular: Normal rate and regular rhythm.  Exam reveals no gallop and no friction rub.   No murmur heard. Pulmonary/Chest: Effort normal. She has no wheezes. She has no rales.  Abdominal: Soft. She exhibits no distension. There is no tenderness. There is no rebound and no guarding.  Musculoskeletal: She exhibits no edema or tenderness.  Neurological: She is alert and oriented to person, place, and time. She has normal strength. No cranial nerve deficit or sensory deficit. She displays a negative Romberg sign. Coordination and gait normal. GCS eye subscore is 4. GCS verbal subscore is 5. GCS motor subscore is 6. She displays no Babinski's sign on  the right side. She displays no Babinski's sign on the left side.  Reflex Scores:      Tricep reflexes are 2+ on the right side and 2+ on the left side.      Bicep reflexes are 2+ on the right side and 2+ on the left side.      Brachioradialis reflexes are 2+ on the right side and 2+ on the left side.      Patellar reflexes are 2+ on the right side and 2+ on the left side.      Achilles reflexes are 2+ on the right side and 2+ on the left side. Benign neuro exam  Skin: Skin is warm and dry. She is not diaphoretic.  Psychiatric: She has a normal mood and affect. Her behavior is normal.    ED Course  Procedures (including critical care time) Labs Review Labs Reviewed - No data to display  Imaging Review No results found. I have personally reviewed and evaluated these images and lab results as part of my medical decision-making.   EKG Interpretation None      MDM   Final diagnoses:  Post concussive syndrome    59 yo F with a chief complaint of a headache. Appears to be post concussive syndrome based on history and physical. Patient with a benign neuro exam. Will give migraine cocktail.  Reassess. Patient needs a follow with her family doctor or a primary provider for this.  Post migraine cocktail feeling much improved. Patient having no continued strange neurologic symptoms. We'll discharge the patient home. Encouraged follow-up with the primary care provider.  4:54 PM:  I have discussed the diagnosis/risks/treatment options with the patient and believe the pt to be eligible for discharge home to follow-up with PCP. We also discussed returning to the ED immediately if new or worsening sx occur. We discussed the sx which are most concerning (e.g., sudden worsening pain, fever, inability to tolerate by mouth) that necessitate immediate return. Medications administered to the patient during their visit and any new prescriptions provided to the patient are listed below.  Medications given during this visit Medications  prochlorperazine (COMPAZINE) injection 10 mg (10 mg Intravenous Given 08/15/15 1537)  diphenhydrAMINE (BENADRYL) injection 25 mg (25 mg Intravenous Given 08/15/15 1537)  ketorolac (TORADOL) 30 MG/ML injection 30 mg (30 mg Intravenous Given 08/15/15 1537)  sodium chloride 0.9 % bolus 1,000 mL (1,000 mLs Intravenous New Bag/Given 08/15/15 1541)  dexamethasone (DECADRON) injection 10 mg (10 mg Intravenous Given 08/15/15 1538)    New Prescriptions   No medications on file    The patient appears reasonably screen and/or stabilized for discharge and I doubt any other medical condition or other [facility] requiring further screening, evaluation, or treatment in the ED at this time prior to discharge.    Deno Etienne, DO 08/15/15 1654

## 2015-08-15 NOTE — ED Notes (Addendum)
Patient reports that yesterday she started to have a headache. Today she has a headache and feels "swimmy headed, and dizzy, headache at the base of her skull, lips are number, and right finders are numb" patient reports " I feel lifeless, and weird". As this Rn is in the room the patient continues to add more and more places that she is having pain at, left ear, left shoulder, tightness in the back of her head, and adds on new complaints ever few seconds.

## 2015-08-15 NOTE — Discharge Instructions (Signed)
The best treatment for you his brain rest. Typically need to see your family doctor or a physician who specializes in concussion. Recurrent visits to the emergency department will probably not be helpful for this. Talk with your workers comp about being able to see a physician who can see multiple times. Post-Concussion Syndrome Post-concussion syndrome is the symptoms that can occur after a head injury. These symptoms can last from weeks to months. HOME CARE   Take medicines only as told by your doctor.  Do not take aspirin.  Sleep with your head raised to help with headaches.  Avoid activities that can cause another head injury.  Do not play contact sports like football, hockey, soccer, or basketball.  Do not do other risky activities like downhill skiing, martial arts, or horseback riding until your doctor says it is okay.  Keep all follow-up visits as told by your doctor. This is important. GET HELP IF:   You have a harder time:  Paying attention.  Focusing.  Remembering.  Learning new information.  Dealing with stress.  You need more time to complete tasks.  You are easily bothered (irritable).  You have more symptoms. Get help if you have any of these symptoms for more than two weeks after your injury:   Long-lasting (chronic) headaches.  Dizziness.  Trouble balancing.  Feeling sick to your stomach (nauseous).  Trouble with your vision.  Noise or light bothers you more.  Depression.  Mood swings.  Feeling worried (anxious).  Easily bothered.  Memory problems.  Trouble concentrating or paying attention.  Sleep problems.  Feeling tired all of the time. GET HELP RIGHT AWAY IF:  You feel confused.  You feel very sleepy.  You are hard to wake up.  You feel sick to your stomach.  You keep throwing up (vomiting).  You feel like you are moving when you are not (vertigo).  Your eyes move back and forth very quickly.  You start shaking  (convulsing) or pass out (faint).  You have very bad headaches that do not get better with medicine.  You cannot use your arms or legs like normal.  One of the black centers of your eyes (pupils) is bigger than the other.  You have clear or bloody fluid coming from your nose or ears.  Your problems get worse, not better. MAKE SURE YOU:  Understand these instructions.  Will watch your condition.  Will get help right away if you are not doing well or get worse.   This information is not intended to replace advice given to you by your health care provider. Make sure you discuss any questions you have with your health care provider.   Document Released: 11/26/2004 Document Revised: 11/09/2014 Document Reviewed: 01/24/2014 Elsevier Interactive Patient Education [employer].

## 2015-10-07 ENCOUNTER — Other Ambulatory Visit (INDEPENDENT_AMBULATORY_CARE_PROVIDER_SITE_OTHER): Payer: Self-pay | Admitting: Internal Medicine

## 2015-10-08 ENCOUNTER — Other Ambulatory Visit (INDEPENDENT_AMBULATORY_CARE_PROVIDER_SITE_OTHER): Payer: Self-pay | Admitting: Internal Medicine

## 2015-10-14 ENCOUNTER — Encounter (INDEPENDENT_AMBULATORY_CARE_PROVIDER_SITE_OTHER): Payer: Self-pay | Admitting: Internal Medicine

## 2015-10-14 ENCOUNTER — Ambulatory Visit (INDEPENDENT_AMBULATORY_CARE_PROVIDER_SITE_OTHER): Payer: 59 | Admitting: Internal Medicine

## 2015-10-14 VITALS — BP 122/60 | HR 72 | Temp 98.0°F | Ht 61.0 in | Wt 128.2 lb

## 2015-10-14 DIAGNOSIS — K219 Gastro-esophageal reflux disease without esophagitis: Secondary | ICD-10-CM | POA: Diagnosis not present
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
## 2015-10-14 NOTE — Patient Instructions (Signed)
OV in 1 year.  

## 2015-12-10 ENCOUNTER — Other Ambulatory Visit (INDEPENDENT_AMBULATORY_CARE_PROVIDER_SITE_OTHER): Payer: Self-pay | Admitting: Internal Medicine

## 2015-12-31 ENCOUNTER — Other Ambulatory Visit (HOSPITAL_COMMUNITY)
Admission: RE | Admit: 2015-12-31 | Discharge: 2015-12-31 | Disposition: A | Discharge status: Home / Self Care | Payer: 59 | Source: Ambulatory Visit | Attending: Adult Health | Admitting: Adult Health

## 2015-12-31 ENCOUNTER — Ambulatory Visit (INDEPENDENT_AMBULATORY_CARE_PROVIDER_SITE_OTHER): Payer: 59 | Admitting: Adult Health

## 2015-12-31 ENCOUNTER — Encounter: Payer: Self-pay | Admitting: Adult Health

## 2015-12-31 VITALS — BP 140/80 | HR 88 | Ht 60.0 in | Wt 129.5 lb

## 2015-12-31 DIAGNOSIS — Z01419 Encounter for gynecological examination (general) (routine) without abnormal findings: Secondary | ICD-10-CM

## 2015-12-31 DIAGNOSIS — Z1151 Encounter for screening for human papillomavirus (HPV): Secondary | ICD-10-CM | POA: Insufficient documentation

## 2015-12-31 DIAGNOSIS — Z1212 Encounter for screening for malignant neoplasm of rectum: Secondary | ICD-10-CM | POA: Diagnosis not present

## 2015-12-31 LAB — HEMOCCULT GUIAC POC 1CARD (OFFICE): Fecal Occult Blood, POC: NEGATIVE

## 2015-12-31 NOTE — Patient Instructions (Signed)
Physical in 1 year, pap in 3 if normal Mammogram yearly colonoscopy per [facility] with PCP Try to double void
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
## 2016-01-01 LAB — CYTOLOGY - PAP

## 2016-01-15 ENCOUNTER — Other Ambulatory Visit (INDEPENDENT_AMBULATORY_CARE_PROVIDER_SITE_OTHER): Payer: Self-pay | Admitting: Internal Medicine

## 2016-01-21 ENCOUNTER — Encounter: Payer: Self-pay | Admitting: Adult Health

## 2016-02-12 ENCOUNTER — Other Ambulatory Visit (INDEPENDENT_AMBULATORY_CARE_PROVIDER_SITE_OTHER): Payer: Self-pay | Admitting: Internal Medicine

## 2016-03-22 ENCOUNTER — Other Ambulatory Visit (INDEPENDENT_AMBULATORY_CARE_PROVIDER_SITE_OTHER): Payer: Self-pay | Admitting: Internal Medicine

## 2016-08-13 DIAGNOSIS — I10 Essential (primary) hypertension: Secondary | ICD-10-CM | POA: Diagnosis not present

## 2016-08-13 DIAGNOSIS — D649 Anemia, unspecified: Secondary | ICD-10-CM | POA: Diagnosis not present

## 2016-08-13 DIAGNOSIS — R7301 Impaired fasting glucose: Secondary | ICD-10-CM | POA: Diagnosis not present

## 2016-08-13 DIAGNOSIS — E782 Mixed hyperlipidemia: Secondary | ICD-10-CM | POA: Diagnosis not present

## 2016-08-13 DIAGNOSIS — K219 Gastro-esophageal reflux disease without esophagitis: Secondary | ICD-10-CM | POA: Diagnosis not present

## 2016-08-19 DIAGNOSIS — Z1212 Encounter for screening for malignant neoplasm of rectum: Secondary | ICD-10-CM | POA: Diagnosis not present

## 2016-08-19 DIAGNOSIS — I10 Essential (primary) hypertension: Secondary | ICD-10-CM | POA: Diagnosis not present

## 2016-08-19 DIAGNOSIS — K219 Gastro-esophageal reflux disease without esophagitis: Secondary | ICD-10-CM | POA: Diagnosis not present

## 2016-08-19 DIAGNOSIS — D649 Anemia, unspecified: Secondary | ICD-10-CM | POA: Diagnosis not present

## 2016-08-19 DIAGNOSIS — E782 Mixed hyperlipidemia: Secondary | ICD-10-CM | POA: Diagnosis not present

## 2016-08-27 DIAGNOSIS — H43811 Vitreous degeneration, right eye: Secondary | ICD-10-CM | POA: Diagnosis not present

## 2016-09-28 ENCOUNTER — Encounter (INDEPENDENT_AMBULATORY_CARE_PROVIDER_SITE_OTHER): Payer: Self-pay | Admitting: Internal Medicine
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
## 2016-10-13 ENCOUNTER — Other Ambulatory Visit (INDEPENDENT_AMBULATORY_CARE_PROVIDER_SITE_OTHER): Payer: Self-pay | Admitting: Internal Medicine

## 2016-10-15 ENCOUNTER — Encounter (INDEPENDENT_AMBULATORY_CARE_PROVIDER_SITE_OTHER): Payer: Self-pay | Admitting: Internal Medicine

## 2016-10-15 ENCOUNTER — Ambulatory Visit (INDEPENDENT_AMBULATORY_CARE_PROVIDER_SITE_OTHER): Payer: BLUE CROSS/BLUE SHIELD | Admitting: Internal Medicine

## 2016-10-15 VITALS — BP 158/78 | HR 84 | Temp 98.0°F | Ht 61.0 in | Wt 136.1 lb

## 2016-10-15 DIAGNOSIS — K219 Gastro-esophageal reflux disease without esophagitis: Secondary | ICD-10-CM | POA: Diagnosis not present

## 2016-10-15 MED ORDER — PANTOPRAZOLE SODIUM 40 MG PO TBEC: 40.0000 mg | DELAYED_RELEASE_TABLET | Freq: Every day | ORAL | 11 refills | Status: DC

## 2016-10-15 NOTE — Patient Instructions (Signed)
OV in 1 year. GERD diet.  
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
## 2016-12-22 DIAGNOSIS — L821 Other seborrheic keratosis: Secondary | ICD-10-CM | POA: Diagnosis not present

## 2016-12-22 DIAGNOSIS — I781 Nevus, non-neoplastic: Secondary | ICD-10-CM | POA: Diagnosis not present

## 2016-12-22 DIAGNOSIS — D225 Melanocytic nevi of trunk: Secondary | ICD-10-CM | POA: Diagnosis not present

## 2016-12-28 DIAGNOSIS — Z6826 Body mass index (BMI) 26.0-26.9, adult: Secondary | ICD-10-CM | POA: Diagnosis not present

## 2016-12-28 DIAGNOSIS — F419 Anxiety disorder, unspecified: Secondary | ICD-10-CM | POA: Diagnosis not present

## 2017-01-04 ENCOUNTER — Encounter: Payer: Self-pay | Admitting: Adult Health

## 2017-01-04 ENCOUNTER — Ambulatory Visit (INDEPENDENT_AMBULATORY_CARE_PROVIDER_SITE_OTHER): Payer: BLUE CROSS/BLUE SHIELD | Admitting: Adult Health

## 2017-01-04 VITALS — BP 140/88 | HR 97 | Ht 61.75 in | Wt 140.0 lb

## 2017-01-04 DIAGNOSIS — Z01419 Encounter for gynecological examination (general) (routine) without abnormal findings: Secondary | ICD-10-CM

## 2017-01-04 DIAGNOSIS — Z01411 Encounter for gynecological examination (general) (routine) with abnormal findings: Secondary | ICD-10-CM | POA: Diagnosis not present

## 2017-01-04 DIAGNOSIS — Z1212 Encounter for screening for malignant neoplasm of rectum: Secondary | ICD-10-CM

## 2017-01-04 DIAGNOSIS — F419 Anxiety disorder, unspecified: Secondary | ICD-10-CM | POA: Diagnosis not present

## 2017-01-04 DIAGNOSIS — Z1211 Encounter for screening for malignant neoplasm of colon: Secondary | ICD-10-CM | POA: Diagnosis not present

## 2017-01-04 LAB — HEMOCCULT GUIAC POC 1CARD (OFFICE): FECAL OCCULT BLD: NEGATIVE

## 2017-01-04 NOTE — Progress Notes (Signed)
Patient ID: Barbara Phillips, female   DOB: 1956-06-23, 61 y.o.   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 PCP is Kassie Mends, at [facility].   Current Medications, Allergies, Past Medical History, Past Surgical History, Family History and Social History were reviewed in [employer] record.     Review of Systems: Patient denies any headaches, hearing loss, fatigue, blurred vision, shortness of breath, chest pain, abdominal pain, problems with bowel movements, urination, or intercourse(not having sex). No joint pain or mood swings. +anxiety, has family stress   Physical Exam:BP 140/88 (BP Location: Left Arm, Patient Position: Sitting, Cuff Size: Normal)   Pulse 97   Ht 5' 1.75" (1.568 m)   Wt 140 lb (63.5 kg)   BMI 25.81 kg/m  General:  Well developed, well nourished, no acute distress Skin:  Warm and dry Neck:  Midline trachea, normal thyroid, good ROM, no lymphadenopathy,no carotid bruits heard Lungs; Clear to auscultation bilaterally Breast:  No dominant palpable mass, retraction, or nipple discharge Cardiovascular: Regular rate and rhythm Abdomen:  Soft, non tender, no hepatosplenomegaly Pelvic:  External genitalia is normal in appearance, no lesions.  The vagina is normal in appearance. Urethra has no lesions or masses. The cervix is bulbous.  Uterus is felt to be normal size, shape, and contour.  No adnexal masses or tenderness noted.Bladder is non tender, no masses felt. Rectal: Good sphincter tone, no polyps, or hemorrhoids felt.  Hemoccult negative. Extremities/musculoskeletal:  No swelling or varicosities noted, no clubbing or cyanosis Psych:  No mood changes, alert and cooperative,seems happy PHQ 2 score  0.  Impression:  1. Well woman exam with routine gynecological exam   2. Screening for colorectal cancer   3. Anxiety      Plan: Physical in 1 year Pap in 2020 Mammogram yearly Colonoscopy at 61 Labs with PCP

## 2017-01-04 NOTE — Patient Instructions (Signed)
Physical in 1 year Pap in 2020 Mammogram yearly Colonoscopy at 12 Labs with PCP

## 2017-01-14 DIAGNOSIS — Z1231 Encounter for screening mammogram for malignant neoplasm of breast: Secondary | ICD-10-CM | POA: Diagnosis not present

## 2017-01-19 DIAGNOSIS — L259 Unspecified contact dermatitis, unspecified cause: Secondary | ICD-10-CM | POA: Diagnosis not present

## 2017-01-19 DIAGNOSIS — Z6826 Body mass index (BMI) 26.0-26.9, adult: Secondary | ICD-10-CM | POA: Diagnosis not present

## 2017-01-19 DIAGNOSIS — F419 Anxiety disorder, unspecified: Secondary | ICD-10-CM | POA: Diagnosis not present

## 2017-01-19 DIAGNOSIS — F329 Major depressive disorder, single episode, unspecified: Secondary | ICD-10-CM | POA: Diagnosis not present

## 2017-02-23 DIAGNOSIS — E119 Type 2 diabetes mellitus without complications: Secondary | ICD-10-CM | POA: Diagnosis not present

## 2017-02-23 DIAGNOSIS — F419 Anxiety disorder, unspecified: Secondary | ICD-10-CM | POA: Diagnosis not present

## 2017-02-23 DIAGNOSIS — D649 Anemia, unspecified: Secondary | ICD-10-CM | POA: Diagnosis not present

## 2017-02-23 DIAGNOSIS — E782 Mixed hyperlipidemia: Secondary | ICD-10-CM | POA: Diagnosis not present

## 2017-02-23 DIAGNOSIS — E7800 Pure hypercholesterolemia, unspecified: Secondary | ICD-10-CM | POA: Diagnosis not present

## 2017-03-02 DIAGNOSIS — L259 Unspecified contact dermatitis, unspecified cause: Secondary | ICD-10-CM | POA: Diagnosis not present

## 2017-03-02 DIAGNOSIS — I10 Essential (primary) hypertension: Secondary | ICD-10-CM | POA: Diagnosis not present

## 2017-03-02 DIAGNOSIS — F329 Major depressive disorder, single episode, unspecified: Secondary | ICD-10-CM | POA: Diagnosis not present

## 2017-03-02 DIAGNOSIS — F419 Anxiety disorder, unspecified: Secondary | ICD-10-CM | POA: Diagnosis not present

## 2017-04-30 DIAGNOSIS — I10 Essential (primary) hypertension: Secondary | ICD-10-CM | POA: Diagnosis not present

## 2017-04-30 DIAGNOSIS — Z6825 Body mass index (BMI) 25.0-25.9, adult: Secondary | ICD-10-CM | POA: Diagnosis not present

## 2017-05-28 DIAGNOSIS — F419 Anxiety disorder, unspecified: Secondary | ICD-10-CM | POA: Diagnosis not present

## 2017-05-28 DIAGNOSIS — Z6825 Body mass index (BMI) 25.0-25.9, adult: Secondary | ICD-10-CM | POA: Diagnosis not present

## 2017-05-28 DIAGNOSIS — I10 Essential (primary) hypertension: Secondary | ICD-10-CM | POA: Diagnosis not present

## 2017-05-28 DIAGNOSIS — R05 Cough: Secondary | ICD-10-CM | POA: Diagnosis not present

## 2017-06-21 DIAGNOSIS — M79674 Pain in right toe(s): Secondary | ICD-10-CM | POA: Diagnosis not present

## 2017-06-21 DIAGNOSIS — M79671 Pain in right foot: Secondary | ICD-10-CM | POA: Diagnosis not present

## 2017-06-21 DIAGNOSIS — L03031 Cellulitis of right toe: Secondary | ICD-10-CM | POA: Diagnosis not present

## 2017-06-21 DIAGNOSIS — L600 Ingrowing nail: Secondary | ICD-10-CM | POA: Diagnosis not present

## 2017-06-29 DIAGNOSIS — E782 Mixed hyperlipidemia: Secondary | ICD-10-CM | POA: Diagnosis not present

## 2017-06-29 DIAGNOSIS — E7800 Pure hypercholesterolemia, unspecified: Secondary | ICD-10-CM | POA: Diagnosis not present

## 2017-06-29 DIAGNOSIS — K219 Gastro-esophageal reflux disease without esophagitis: Secondary | ICD-10-CM | POA: Diagnosis not present

## 2017-06-29 DIAGNOSIS — I10 Essential (primary) hypertension: Secondary | ICD-10-CM | POA: Diagnosis not present

## 2017-06-30 DIAGNOSIS — M546 Pain in thoracic spine: Secondary | ICD-10-CM | POA: Diagnosis not present

## 2017-06-30 DIAGNOSIS — S336XXA Sprain of sacroiliac joint, initial encounter: Secondary | ICD-10-CM | POA: Diagnosis not present

## 2017-07-01 DIAGNOSIS — S336XXA Sprain of sacroiliac joint, initial encounter: Secondary | ICD-10-CM | POA: Diagnosis not present

## 2017-07-01 DIAGNOSIS — M546 Pain in thoracic spine: Secondary | ICD-10-CM | POA: Diagnosis not present

## 2017-07-06 DIAGNOSIS — R05 Cough: Secondary | ICD-10-CM | POA: Diagnosis not present

## 2017-07-06 DIAGNOSIS — S336XXA Sprain of sacroiliac joint, initial encounter: Secondary | ICD-10-CM | POA: Diagnosis not present

## 2017-07-06 DIAGNOSIS — F419 Anxiety disorder, unspecified: Secondary | ICD-10-CM | POA: Diagnosis not present

## 2017-07-06 DIAGNOSIS — M546 Pain in thoracic spine: Secondary | ICD-10-CM | POA: Diagnosis not present

## 2017-07-06 DIAGNOSIS — E782 Mixed hyperlipidemia: Secondary | ICD-10-CM | POA: Diagnosis not present

## 2017-07-06 DIAGNOSIS — I10 Essential (primary) hypertension: Secondary | ICD-10-CM | POA: Diagnosis not present

## 2017-07-12 DIAGNOSIS — L600 Ingrowing nail: Secondary | ICD-10-CM | POA: Diagnosis not present

## 2017-07-12 DIAGNOSIS — M79671 Pain in right foot: Secondary | ICD-10-CM | POA: Diagnosis not present

## 2017-07-12 DIAGNOSIS — M79674 Pain in right toe(s): Secondary | ICD-10-CM | POA: Diagnosis not present

## 2017-07-12 DIAGNOSIS — L03031 Cellulitis of right toe: Secondary | ICD-10-CM | POA: Diagnosis not present

## 2017-07-13 DIAGNOSIS — M546 Pain in thoracic spine: Secondary | ICD-10-CM | POA: Diagnosis not present

## 2017-07-13 DIAGNOSIS — S336XXA Sprain of sacroiliac joint, initial encounter: Secondary | ICD-10-CM | POA: Diagnosis not present

## 2017-07-20 DIAGNOSIS — S336XXA Sprain of sacroiliac joint, initial encounter: Secondary | ICD-10-CM | POA: Diagnosis not present

## 2017-07-20 DIAGNOSIS — M546 Pain in thoracic spine: Secondary | ICD-10-CM | POA: Diagnosis not present

## 2017-07-28 DIAGNOSIS — M546 Pain in thoracic spine: Secondary | ICD-10-CM | POA: Diagnosis not present

## 2017-07-28 DIAGNOSIS — S336XXA Sprain of sacroiliac joint, initial encounter: Secondary | ICD-10-CM | POA: Diagnosis not present

## 2017-07-28 DIAGNOSIS — M79671 Pain in right foot: Secondary | ICD-10-CM | POA: Diagnosis not present

## 2017-07-28 DIAGNOSIS — L03031 Cellulitis of right toe: Secondary | ICD-10-CM | POA: Diagnosis not present

## 2017-07-28 DIAGNOSIS — L600 Ingrowing nail: Secondary | ICD-10-CM | POA: Diagnosis not present

## 2017-07-28 DIAGNOSIS — M79674 Pain in right toe(s): Secondary | ICD-10-CM | POA: Diagnosis not present

## 2017-08-05 DIAGNOSIS — M546 Pain in thoracic spine: Secondary | ICD-10-CM | POA: Diagnosis not present

## 2017-08-05 DIAGNOSIS — S336XXA Sprain of sacroiliac joint, initial encounter: Secondary | ICD-10-CM | POA: Diagnosis not present

## 2017-08-09 DIAGNOSIS — S336XXA Sprain of sacroiliac joint, initial encounter: Secondary | ICD-10-CM | POA: Diagnosis not present

## 2017-08-09 DIAGNOSIS — M546 Pain in thoracic spine: Secondary | ICD-10-CM | POA: Diagnosis not present

## 2017-08-17 DIAGNOSIS — M546 Pain in thoracic spine: Secondary | ICD-10-CM | POA: Diagnosis not present

## 2017-08-17 DIAGNOSIS — S336XXA Sprain of sacroiliac joint, initial encounter: Secondary | ICD-10-CM | POA: Diagnosis not present

## 2017-08-18 DIAGNOSIS — I10 Essential (primary) hypertension: Secondary | ICD-10-CM | POA: Diagnosis not present

## 2017-08-18 DIAGNOSIS — F419 Anxiety disorder, unspecified: Secondary | ICD-10-CM | POA: Diagnosis not present

## 2017-08-18 DIAGNOSIS — Z23 Encounter for immunization: Secondary | ICD-10-CM | POA: Diagnosis not present

## 2017-08-18 DIAGNOSIS — R05 Cough: Secondary | ICD-10-CM | POA: Diagnosis not present

## 2017-08-18 DIAGNOSIS — E782 Mixed hyperlipidemia: Secondary | ICD-10-CM | POA: Diagnosis not present

## 2017-08-20 DIAGNOSIS — R197 Diarrhea, unspecified: Secondary | ICD-10-CM | POA: Diagnosis not present

## 2017-08-24 DIAGNOSIS — S336XXA Sprain of sacroiliac joint, initial encounter: Secondary | ICD-10-CM | POA: Diagnosis not present

## 2017-08-24 DIAGNOSIS — M546 Pain in thoracic spine: Secondary | ICD-10-CM | POA: Diagnosis not present

## 2017-09-08 DIAGNOSIS — M546 Pain in thoracic spine: Secondary | ICD-10-CM | POA: Diagnosis not present

## 2017-09-08 DIAGNOSIS — S336XXA Sprain of sacroiliac joint, initial encounter: Secondary | ICD-10-CM | POA: Diagnosis not present

## 2017-09-15 DIAGNOSIS — H5005 Alternating esotropia: Secondary | ICD-10-CM | POA: Diagnosis not present

## 2017-09-15 DIAGNOSIS — H43811 Vitreous degeneration, right eye: Secondary | ICD-10-CM | POA: Diagnosis not present

## 2017-09-20 ENCOUNTER — Encounter (INDEPENDENT_AMBULATORY_CARE_PROVIDER_SITE_OTHER): Payer: Self-pay | Admitting: Internal Medicine

## 2017-10-18 ENCOUNTER — Encounter (INDEPENDENT_AMBULATORY_CARE_PROVIDER_SITE_OTHER): Payer: Self-pay | Admitting: Internal Medicine

## 2017-10-18 ENCOUNTER — Ambulatory Visit (INDEPENDENT_AMBULATORY_CARE_PROVIDER_SITE_OTHER): Payer: BLUE CROSS/BLUE SHIELD | Admitting: Internal Medicine

## 2017-10-18 VITALS — BP 142/72 | HR 84 | Temp 98.8°F | Ht 61.0 in | Wt 142.8 lb

## 2017-10-18 DIAGNOSIS — K219 Gastro-esophageal reflux disease without esophagitis: Secondary | ICD-10-CM | POA: Diagnosis not present

## 2017-10-18 NOTE — Patient Instructions (Signed)
Continue the Protonix  OV 1 year.  
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
## 2017-10-29 ENCOUNTER — Other Ambulatory Visit (INDEPENDENT_AMBULATORY_CARE_PROVIDER_SITE_OTHER): Payer: Self-pay | Admitting: Internal Medicine

## 2017-10-29 DIAGNOSIS — K219 Gastro-esophageal reflux disease without esophagitis: Secondary | ICD-10-CM

## 2017-11-23 DIAGNOSIS — E782 Mixed hyperlipidemia: Secondary | ICD-10-CM | POA: Diagnosis not present

## 2017-11-23 DIAGNOSIS — I10 Essential (primary) hypertension: Secondary | ICD-10-CM | POA: Diagnosis not present

## 2017-11-23 DIAGNOSIS — F419 Anxiety disorder, unspecified: Secondary | ICD-10-CM | POA: Diagnosis not present

## 2017-11-23 DIAGNOSIS — R05 Cough: Secondary | ICD-10-CM | POA: Diagnosis not present

## 2017-12-09 DIAGNOSIS — X32XXXA Exposure to sunlight, initial encounter: Secondary | ICD-10-CM | POA: Diagnosis not present

## 2017-12-09 DIAGNOSIS — L570 Actinic keratosis: Secondary | ICD-10-CM | POA: Diagnosis not present

## 2017-12-09 DIAGNOSIS — L728 Other follicular cysts of the skin and subcutaneous tissue: Secondary | ICD-10-CM | POA: Diagnosis not present

## 2018-01-10 ENCOUNTER — Encounter: Payer: Self-pay | Admitting: Adult Health

## 2018-01-10 ENCOUNTER — Ambulatory Visit: Payer: BLUE CROSS/BLUE SHIELD | Admitting: Adult Health

## 2018-01-10 VITALS — BP 126/72 | HR 94 | Resp 15 | Ht 61.75 in | Wt 143.5 lb

## 2018-01-10 DIAGNOSIS — Z1211 Encounter for screening for malignant neoplasm of colon: Secondary | ICD-10-CM

## 2018-01-10 DIAGNOSIS — Z01419 Encounter for gynecological examination (general) (routine) without abnormal findings: Secondary | ICD-10-CM | POA: Insufficient documentation

## 2018-01-10 DIAGNOSIS — Z1212 Encounter for screening for malignant neoplasm of rectum: Secondary | ICD-10-CM | POA: Diagnosis not present

## 2018-01-10 LAB — HEMOCCULT GUIAC POC 1CARD (OFFICE): FECAL OCCULT BLD: NEGATIVE

## 2018-01-10 NOTE — Progress Notes (Signed)
Patient ID: Barbara Phillips, female   DOB: Apr 01, 1956, 62 y.o.   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  PCP is Kassie Mends PA at [facility].    Current Medications, Allergies, Past Medical History, Past Surgical History, Family History and Social History were reviewed in [employer] record.     Review of Systems: Patient denies any headaches, hearing loss, fatigue, blurred vision, shortness of breath, chest pain, abdominal pain, problems with bowel movements, urination(dribbles some esp after coffee in am, try voiding, stand and then void again), or intercourse(not having sex). No joint pain or mood swings.    Physical Exam:BP 126/72 (BP Location: Left Arm, Patient Position: Sitting, Cuff Size: Normal)   Pulse 94   Resp 15   Ht 5' 1.75" (1.568 m)   Wt 143 lb 8 oz (65.1 kg)   BMI 26.46 kg/m  General:  Well developed, well nourished, no acute distress Skin:  Warm and dry Neck:  Midline trachea, normal thyroid, good ROM, no lymphadenopathy Lungs; Clear to auscultation bilaterally Breast:  No dominant palpable mass, retraction, or nipple discharge Cardiovascular: Regular rate and rhythm Abdomen:  Soft, non tender, no hepatosplenomegaly Pelvic:  External genitalia is normal in appearance, no lesions.  The vagina is pale with loss of moisture and rugae. Urethra has no lesions or masses. The cervix is smooth and atrophic.  Uterus is felt to be normal size, shape, and contour.  No adnexal masses or tenderness noted.Bladder is non tender, no masses felt. Rectal: Good sphincter tone, no polyps, or hemorrhoids felt.  Hemoccult negative. Extremities/musculoskeletal:  No swelling or varicosities noted, no clubbing or  cyanosis Psych:  No mood changes, alert and cooperative,seems happy PHQ 2 score 0.  Impression:   ICD-10-CM   1. Well woman exam with routine gynecological exam Z01.419   2. Screening for colorectal cancer Z12.11 POCT occult blood stool   Z12.12       Plan: Pap and physical in 1 year Mammogram next week and yearly Labs with PCP  Colonoscopy per GI

## 2018-01-18 DIAGNOSIS — Z1231 Encounter for screening mammogram for malignant neoplasm of breast: Secondary | ICD-10-CM | POA: Diagnosis not present

## 2018-03-22 DIAGNOSIS — K219 Gastro-esophageal reflux disease without esophagitis: Secondary | ICD-10-CM | POA: Diagnosis not present

## 2018-03-22 DIAGNOSIS — E782 Mixed hyperlipidemia: Secondary | ICD-10-CM | POA: Diagnosis not present

## 2018-03-22 DIAGNOSIS — I10 Essential (primary) hypertension: Secondary | ICD-10-CM | POA: Diagnosis not present

## 2018-03-22 DIAGNOSIS — R7301 Impaired fasting glucose: Secondary | ICD-10-CM | POA: Diagnosis not present

## 2018-03-25 DIAGNOSIS — F419 Anxiety disorder, unspecified: Secondary | ICD-10-CM | POA: Diagnosis not present

## 2018-03-25 DIAGNOSIS — I10 Essential (primary) hypertension: Secondary | ICD-10-CM | POA: Diagnosis not present

## 2018-03-25 DIAGNOSIS — Z1331 Encounter for screening for depression: Secondary | ICD-10-CM | POA: Diagnosis not present

## 2018-03-25 DIAGNOSIS — E782 Mixed hyperlipidemia: Secondary | ICD-10-CM | POA: Diagnosis not present

## 2018-03-25 DIAGNOSIS — R05 Cough: Secondary | ICD-10-CM | POA: Diagnosis not present

## 2018-03-25 DIAGNOSIS — Z1389 Encounter for screening for other disorder: Secondary | ICD-10-CM | POA: Diagnosis not present

## 2018-04-19 ENCOUNTER — Other Ambulatory Visit: Payer: Self-pay | Admitting: Adult Health

## 2018-06-09 DIAGNOSIS — Z23 Encounter for immunization: Secondary | ICD-10-CM | POA: Diagnosis not present

## 2018-07-19 DIAGNOSIS — R05 Cough: Secondary | ICD-10-CM | POA: Diagnosis not present

## 2018-07-19 DIAGNOSIS — I10 Essential (primary) hypertension: Secondary | ICD-10-CM | POA: Diagnosis not present

## 2018-07-19 DIAGNOSIS — Z23 Encounter for immunization: Secondary | ICD-10-CM | POA: Diagnosis not present

## 2018-07-19 DIAGNOSIS — E782 Mixed hyperlipidemia: Secondary | ICD-10-CM | POA: Diagnosis not present

## 2018-07-19 DIAGNOSIS — F419 Anxiety disorder, unspecified: Secondary | ICD-10-CM | POA: Diagnosis not present

## 2018-08-10 DIAGNOSIS — Z23 Encounter for immunization: Secondary | ICD-10-CM | POA: Diagnosis not present

## 2018-10-18 ENCOUNTER — Encounter (INDEPENDENT_AMBULATORY_CARE_PROVIDER_SITE_OTHER): Payer: Self-pay | Admitting: Internal Medicine

## 2018-10-18 ENCOUNTER — Ambulatory Visit (INDEPENDENT_AMBULATORY_CARE_PROVIDER_SITE_OTHER): Payer: BLUE CROSS/BLUE SHIELD | Admitting: Internal Medicine

## 2018-10-18 VITALS — BP 140/70 | HR 64 | Temp 98.2°F | Ht 61.0 in | Wt 136.9 lb

## 2018-10-18 DIAGNOSIS — K219 Gastro-esophageal reflux disease without esophagitis: Secondary | ICD-10-CM | POA: Diagnosis not present

## 2018-10-18 MED ORDER — PANTOPRAZOLE SODIUM 40 MG PO TBEC: 40.0000 mg | DELAYED_RELEASE_TABLET | Freq: Every day | ORAL | 3 refills | Status: DC
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
## 2018-10-18 NOTE — Patient Instructions (Signed)
Continue the Protonix. OV in 1 year.  

## 2018-11-09 DIAGNOSIS — E782 Mixed hyperlipidemia: Secondary | ICD-10-CM | POA: Diagnosis not present

## 2018-11-09 DIAGNOSIS — K219 Gastro-esophageal reflux disease without esophagitis: Secondary | ICD-10-CM | POA: Diagnosis not present

## 2018-11-09 DIAGNOSIS — I10 Essential (primary) hypertension: Secondary | ICD-10-CM | POA: Diagnosis not present

## 2018-11-09 DIAGNOSIS — E119 Type 2 diabetes mellitus without complications: Secondary | ICD-10-CM | POA: Diagnosis not present

## 2018-11-09 DIAGNOSIS — E7800 Pure hypercholesterolemia, unspecified: Secondary | ICD-10-CM | POA: Diagnosis not present

## 2018-11-11 DIAGNOSIS — F419 Anxiety disorder, unspecified: Secondary | ICD-10-CM | POA: Diagnosis not present

## 2018-11-11 DIAGNOSIS — E782 Mixed hyperlipidemia: Secondary | ICD-10-CM | POA: Diagnosis not present

## 2018-11-11 DIAGNOSIS — I10 Essential (primary) hypertension: Secondary | ICD-10-CM | POA: Diagnosis not present

## 2018-11-11 DIAGNOSIS — Z6826 Body mass index (BMI) 26.0-26.9, adult: Secondary | ICD-10-CM | POA: Diagnosis not present

## 2018-12-02 DIAGNOSIS — H5213 Myopia, bilateral: Secondary | ICD-10-CM | POA: Diagnosis not present

## 2018-12-02 DIAGNOSIS — H50011 Monocular esotropia, right eye: Secondary | ICD-10-CM | POA: Diagnosis not present

## 2018-12-02 DIAGNOSIS — H04123 Dry eye syndrome of bilateral lacrimal glands: Secondary | ICD-10-CM | POA: Diagnosis not present

## 2018-12-02 DIAGNOSIS — H52223 Regular astigmatism, bilateral: Secondary | ICD-10-CM | POA: Diagnosis not present

## 2019-01-13 ENCOUNTER — Other Ambulatory Visit: Payer: Self-pay | Admitting: Adult Health

## 2019-01-18 DIAGNOSIS — Z6826 Body mass index (BMI) 26.0-26.9, adult: Secondary | ICD-10-CM | POA: Diagnosis not present

## 2019-01-18 DIAGNOSIS — J069 Acute upper respiratory infection, unspecified: Secondary | ICD-10-CM | POA: Diagnosis not present

## 2019-01-18 DIAGNOSIS — H66001 Acute suppurative otitis media without spontaneous rupture of ear drum, right ear: Secondary | ICD-10-CM | POA: Diagnosis not present

## 2019-02-14 ENCOUNTER — Other Ambulatory Visit: Payer: BLUE CROSS/BLUE SHIELD | Admitting: Adult Health

## 2019-03-07 DIAGNOSIS — I10 Essential (primary) hypertension: Secondary | ICD-10-CM | POA: Diagnosis not present

## 2019-03-07 DIAGNOSIS — E7800 Pure hypercholesterolemia, unspecified: Secondary | ICD-10-CM | POA: Diagnosis not present

## 2019-03-07 DIAGNOSIS — E119 Type 2 diabetes mellitus without complications: Secondary | ICD-10-CM | POA: Diagnosis not present

## 2019-03-07 DIAGNOSIS — E782 Mixed hyperlipidemia: Secondary | ICD-10-CM | POA: Diagnosis not present

## 2019-03-07 DIAGNOSIS — K219 Gastro-esophageal reflux disease without esophagitis: Secondary | ICD-10-CM | POA: Diagnosis not present

## 2019-03-10 DIAGNOSIS — F419 Anxiety disorder, unspecified: Secondary | ICD-10-CM | POA: Diagnosis not present

## 2019-03-10 DIAGNOSIS — I10 Essential (primary) hypertension: Secondary | ICD-10-CM | POA: Diagnosis not present

## 2019-03-10 DIAGNOSIS — Z6825 Body mass index (BMI) 25.0-25.9, adult: Secondary | ICD-10-CM | POA: Diagnosis not present

## 2019-03-10 DIAGNOSIS — E782 Mixed hyperlipidemia: Secondary | ICD-10-CM | POA: Diagnosis not present

## 2019-04-19 ENCOUNTER — Telehealth: Payer: Self-pay | Admitting: Adult Health

## 2019-04-19 MED ORDER — TRIAMCINOLONE ACETONIDE 0.025 % EX OINT: TOPICAL_OINTMENT | CUTANEOUS | 1 refills | Status: DC

## 2019-04-19 NOTE — Telephone Encounter (Signed)
Refilled kenalog 

## 2019-04-19 NOTE — Telephone Encounter (Signed)
Pt called requesting a refill on Kenalog ointment. Thanks!! [place]

## 2019-04-19 NOTE — Telephone Encounter (Signed)
Patient called, requesting a refill on Kenalog.  [employer]  [phone]

## 2019-07-07 DIAGNOSIS — E782 Mixed hyperlipidemia: Secondary | ICD-10-CM | POA: Diagnosis not present

## 2019-07-07 DIAGNOSIS — I10 Essential (primary) hypertension: Secondary | ICD-10-CM | POA: Diagnosis not present

## 2019-07-07 DIAGNOSIS — E7800 Pure hypercholesterolemia, unspecified: Secondary | ICD-10-CM | POA: Diagnosis not present

## 2019-07-07 DIAGNOSIS — R7301 Impaired fasting glucose: Secondary | ICD-10-CM | POA: Diagnosis not present

## 2019-07-07 DIAGNOSIS — R42 Dizziness and giddiness: Secondary | ICD-10-CM | POA: Diagnosis not present

## 2019-07-07 DIAGNOSIS — D649 Anemia, unspecified: Secondary | ICD-10-CM | POA: Diagnosis not present

## 2019-07-07 DIAGNOSIS — E119 Type 2 diabetes mellitus without complications: Secondary | ICD-10-CM | POA: Diagnosis not present

## 2019-07-11 DIAGNOSIS — E782 Mixed hyperlipidemia: Secondary | ICD-10-CM | POA: Diagnosis not present

## 2019-07-11 DIAGNOSIS — I10 Essential (primary) hypertension: Secondary | ICD-10-CM | POA: Diagnosis not present

## 2019-07-11 DIAGNOSIS — F419 Anxiety disorder, unspecified: Secondary | ICD-10-CM | POA: Diagnosis not present

## 2019-07-11 DIAGNOSIS — Z23 Encounter for immunization: Secondary | ICD-10-CM | POA: Diagnosis not present

## 2019-07-11 DIAGNOSIS — Z681 Body mass index (BMI) 19 or less, adult: Secondary | ICD-10-CM | POA: Diagnosis not present

## 2019-08-17 DIAGNOSIS — Z1231 Encounter for screening mammogram for malignant neoplasm of breast: Secondary | ICD-10-CM | POA: Diagnosis not present

## 2019-10-03 ENCOUNTER — Ambulatory Visit (INDEPENDENT_AMBULATORY_CARE_PROVIDER_SITE_OTHER): Payer: BC Managed Care – PPO | Admitting: Adult Health

## 2019-10-03 ENCOUNTER — Other Ambulatory Visit: Payer: Self-pay

## 2019-10-03 ENCOUNTER — Encounter

## 2019-10-03 ENCOUNTER — Encounter: Payer: Self-pay | Admitting: Adult Health

## 2019-10-03 VITALS — BP 138/81 | HR 91 | Ht 61.0 in | Wt 141.4 lb

## 2019-10-03 DIAGNOSIS — Z01419 Encounter for gynecological examination (general) (routine) without abnormal findings: Secondary | ICD-10-CM | POA: Diagnosis not present

## 2019-10-03 DIAGNOSIS — Z1212 Encounter for screening for malignant neoplasm of rectum: Secondary | ICD-10-CM | POA: Diagnosis not present

## 2019-10-03 DIAGNOSIS — Z1211 Encounter for screening for malignant neoplasm of colon: Secondary | ICD-10-CM | POA: Diagnosis not present

## 2019-10-03 LAB — HEMOCCULT GUIAC POC 1CARD (OFFICE): Fecal Occult Blood, POC: NEGATIVE

## 2019-10-03 MED ORDER — TRIAMCINOLONE ACETONIDE 0.025 % EX OINT: TOPICAL_OINTMENT | CUTANEOUS | 1 refills | Status: DC

## 2019-10-03 NOTE — Progress Notes (Signed)
Patient ID: Barbara Phillips, female   DOB: Aug 17, 1956, 63 y.o.   MRN: [id number] History of Present Illness: Barbara Phillips is a 63 year old white female, married, PM in for a well woman gyn exam and pap. PCP is Kassie Mends PA.    Current Medications, Allergies, Past Medical History, Past Surgical History, Family History and Social History were reviewed in [employer] record.     Review of Systems: Patient denies any headaches, hearing loss, fatigue, blurred vision, shortness of breath, chest pain, abdominal pain, problems with bowel movements, urination, or intercourse(not active). No joint pain or mood swings. Can't sleep, as well, wakes up in middle of night,but sleeps from 8 pm to 2 am.   Physical Exam:BP 138/81 (BP Location: Left Arm, Patient Position: Sitting, Cuff Size: Normal)   Pulse 91   Ht 5\' 1"  (1.549 m)   Wt 141 lb 6.4 oz (64.1 kg)   BMI 26.72 kg/m  General:  Well developed, well nourished, no acute distress Skin:  Warm and dry Neck:  Midline trachea, normal thyroid, good ROM, no lymphadenopathy,no carotid bruits heard  Lungs; Clear to auscultation bilaterally Breast:  No dominant palpable mass, retraction, or nipple discharge Cardiovascular: Regular rate and rhythm Abdomen:  Soft, non tender, no hepatosplenomegaly Pelvic:  External genitalia is normal in appearance, no lesions.  The vagina is pale and has loss of moisture and rugae. Urethra has no lesions or masses. The cervix is smooth, pap with high risk HPV, 16/18 genotyping.  Uterus is felt to be normal size, shape, and contour.  No adnexal masses or tenderness noted.Bladder is non tender, no masses felt. Rectal: Good sphincter tone, no polyps, or hemorrhoids felt.  Hemoccult negative. Extremities/musculoskeletal:  No swelling or varicosities noted, no clubbing or cyanosis Psych:  No mood changes, alert and cooperative,seems happy Fall risk is low PHQ 2 score is 0.  Examination chaperoned by Rolena Infante  LPN  Impression and Plan: 1. Encounter for gynecological examination with Papanicolaou smear of cervix Pap sent Physical in 1 year Mammogram yearly Labs with PCP   2. Screening for colorectal cancer Colonoscopy per GI

## 2019-10-04 ENCOUNTER — Other Ambulatory Visit (HOSPITAL_COMMUNITY)
Admission: RE | Admit: 2019-10-04 | Discharge: 2019-10-04 | Disposition: A | Discharge status: Home / Self Care | Payer: BC Managed Care – PPO | Source: Ambulatory Visit | Attending: Adult Health | Admitting: Adult Health

## 2019-10-04 ENCOUNTER — Other Ambulatory Visit: Payer: Self-pay | Admitting: *Deleted

## 2019-10-04 DIAGNOSIS — Z01419 Encounter for gynecological examination (general) (routine) without abnormal findings: Secondary | ICD-10-CM

## 2019-10-05 LAB — CYTOLOGY - PAP
Comment: NEGATIVE
Diagnosis: NEGATIVE
High risk HPV: NEGATIVE

## 2019-10-10 ENCOUNTER — Other Ambulatory Visit (INDEPENDENT_AMBULATORY_CARE_PROVIDER_SITE_OTHER): Payer: Self-pay | Admitting: *Deleted

## 2019-10-10 DIAGNOSIS — K219 Gastro-esophageal reflux disease without esophagitis: Secondary | ICD-10-CM

## 2019-10-10 MED ORDER — PANTOPRAZOLE SODIUM 40 MG PO TBEC: 40.0000 mg | DELAYED_RELEASE_TABLET | Freq: Every day | ORAL | 3 refills | Status: DC

## 2019-10-12 ENCOUNTER — Other Ambulatory Visit: Payer: Self-pay

## 2019-10-12 ENCOUNTER — Encounter (INDEPENDENT_AMBULATORY_CARE_PROVIDER_SITE_OTHER): Payer: Self-pay | Admitting: Nurse Practitioner

## 2019-10-12 ENCOUNTER — Ambulatory Visit (INDEPENDENT_AMBULATORY_CARE_PROVIDER_SITE_OTHER): Payer: BC Managed Care – PPO | Admitting: Nurse Practitioner

## 2019-10-12 VITALS — BP 133/74 | HR 98 | Temp 97.9°F | Ht 61.0 in | Wt 136.0 lb

## 2019-10-12 DIAGNOSIS — K219 Gastro-esophageal reflux disease without esophagitis: Secondary | ICD-10-CM | POA: Diagnosis not present

## 2019-10-12 NOTE — Patient Instructions (Signed)
1.  Continue pantoprazole 40 mg once daily  2.  Call our office if your reflux symptoms are not well controlled  3.  Your next colonoscopy is due November 2023 as previously verified by Dr. Dorien Chihuahua
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
## 2019-10-19 ENCOUNTER — Ambulatory Visit (INDEPENDENT_AMBULATORY_CARE_PROVIDER_SITE_OTHER): Payer: BLUE CROSS/BLUE SHIELD | Admitting: Nurse Practitioner

## 2019-11-02 DIAGNOSIS — E7800 Pure hypercholesterolemia, unspecified: Secondary | ICD-10-CM | POA: Diagnosis not present

## 2019-11-02 DIAGNOSIS — E119 Type 2 diabetes mellitus without complications: Secondary | ICD-10-CM | POA: Diagnosis not present

## 2019-11-02 DIAGNOSIS — I10 Essential (primary) hypertension: Secondary | ICD-10-CM | POA: Diagnosis not present

## 2019-11-02 DIAGNOSIS — R5383 Other fatigue: Secondary | ICD-10-CM | POA: Diagnosis not present

## 2019-11-02 DIAGNOSIS — E782 Mixed hyperlipidemia: Secondary | ICD-10-CM | POA: Diagnosis not present

## 2020-09-09 ENCOUNTER — Ambulatory Visit: Payer: Self-pay | Admitting: Physician Assistant
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
## 2020-10-02 ENCOUNTER — Other Ambulatory Visit (INDEPENDENT_AMBULATORY_CARE_PROVIDER_SITE_OTHER): Payer: Self-pay | Admitting: Internal Medicine

## 2020-10-02 DIAGNOSIS — K219 Gastro-esophageal reflux disease without esophagitis: Secondary | ICD-10-CM

## 2020-10-08 ENCOUNTER — Other Ambulatory Visit: Payer: Self-pay

## 2020-10-08 ENCOUNTER — Encounter: Payer: Self-pay | Admitting: Adult Health

## 2020-10-08 ENCOUNTER — Ambulatory Visit (INDEPENDENT_AMBULATORY_CARE_PROVIDER_SITE_OTHER): Payer: 59 | Admitting: Adult Health

## 2020-10-08 VITALS — BP 131/78 | HR 87 | Ht 61.0 in | Wt 143.0 lb

## 2020-10-08 DIAGNOSIS — Z01419 Encounter for gynecological examination (general) (routine) without abnormal findings: Secondary | ICD-10-CM | POA: Diagnosis not present

## 2020-10-08 DIAGNOSIS — Z1211 Encounter for screening for malignant neoplasm of colon: Secondary | ICD-10-CM

## 2020-10-08 LAB — HEMOCCULT GUIAC POC 1CARD (OFFICE): Fecal Occult Blood, POC: NEGATIVE

## 2020-10-08 NOTE — Progress Notes (Signed)
Patient ID: Barbara Phillips, female   DOB: 1955-12-31, 64 y.o.   MRN: [id number] History of Present Illness:  Barbara Phillips is a 64 year old white female, married, PM in for well woman gyn exam, she ha d negative pap with negative HPV 10/04/2019. She is having right hip replacement in January. PCP is Kassie Mends PA.  Current Medications, Allergies, Past Medical History, Past Surgical History, Family History and Social History were reviewed in [employer] record.     Review of Systems: Patient denies any headaches, hearing loss, fatigue, blurred vision, shortness of breath, chest pain, abdominal pain, problems with bowel movements, urination, or intercourse(not active). No joint pain or mood swings. 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    Physical Exam:BP 131/78 (BP Location: Left Arm, Patient Position: Sitting, Cuff Size: Normal)   Pulse 87   Ht 5\' 1"  (1.549 m)   Wt 143 lb (64.9 kg)   BMI 27.02 kg/m  General:  Well developed, well nourished, no acute distress Skin:  Warm and dry Neck:  Midline trachea, normal thyroid, good ROM, no lymphadenopathy,no carotid bruits heard Lungs; Clear to auscultation bilaterally Breast:  No dominant palpable mass, retraction, or nipple discharge Cardiovascular: Regular rate and rhythm Abdomen:  Soft, non tender, no hepatosplenomegaly Pelvic:  External genitalia is normal in appearance, no lesions.  The vagina is pale with loss of moisture and rugae. Urethra has no lesions or masses. The cervix is smooth.  Uterus is felt to be normal size, shape, and contour.  No adnexal masses or tenderness noted.Bladder is non tender, no masses felt. Rectal: Good sphincter tone, no polyps, or hemorrhoids felt.  Hemoccult negative. Extremities/musculoskeletal:  No swelling or varicosities noted, no clubbing or cyanosis Psych:  No mood changes, alert and cooperative,seems happy AA is 5  Fall risk is low PHQ 9 score is  0  [facility] - 10/08/20 0934      Pregnancy Intention Screening   Does the patient want to become pregnant in the next year? N/A    Does the patient's partner want to become pregnant in the next year? N/A      Contraception Wrap Up   Current Method No Method - Other Reason   postmenopause   End Method No Method - Other Reason   postmenopause   Contraception Counseling Provided No         Examination chaperoned by Tish RN.  Impression and Plan: 1. Encounter for well woman exam with routine gynecological exam Physical in 1 year Pap in 2023 Mammogram yearly Colonoscopy per [facility] with PCP Try to quit smoking before surgery, has nicotine gum  2. Encounter for screening fecal occult blood testing

## 2020-10-15 ENCOUNTER — Ambulatory Visit (INDEPENDENT_AMBULATORY_CARE_PROVIDER_SITE_OTHER): Payer: 59 | Admitting: Internal Medicine

## 2020-10-15 ENCOUNTER — Other Ambulatory Visit: Payer: Self-pay

## 2020-10-15 ENCOUNTER — Encounter (INDEPENDENT_AMBULATORY_CARE_PROVIDER_SITE_OTHER): Payer: Self-pay | Admitting: Internal Medicine

## 2020-10-15 DIAGNOSIS — K219 Gastro-esophageal reflux disease without esophagitis: Secondary | ICD-10-CM | POA: Diagnosis not present

## 2020-10-15 MED ORDER — PANTOPRAZOLE SODIUM 40 MG PO TBEC: 40.0000 mg | DELAYED_RELEASE_TABLET | Freq: Every day | ORAL | 2 refills | Status: DC

## 2020-10-15 NOTE — Progress Notes (Signed)
Presenting complaint;  Follow for chronic GERD.  Database and subjective:  Patient is 64 year old Caucasian female who has chronic GERD and is here for yearly visit.  She was last seen in December 2020.  She had EGD back in October 2015 when she was found to have small sliding hiatal hernia.  She was also complaining of dysphagia at the time and underwent esophageal dilation with symptomatic relief.  She states she is doing well with pantoprazole.  She may have heartburn once a month or less frequently.  She tries to avoid foods which trigger heartburn such as orange juice or tomato juice.  She denies hoarseness chronic cough sore throat or dysphagia.  She has good appetite.  She has gained 5 pounds since her last visit.  Weight gain may be due to the fact that she is having constant hip pain and unable to do much walking.  She is scheduled for right hip replacement on 11/26/2020. She says her bowels move daily.  She denies melena or rectal bleeding.  She drinks alcohol socially but not daily.  She is doing her best to quit cigarette smoking.  Current Medications: Outpatient Encounter Medications as of 10/15/2020  Medication Sig  . Cholecalciferol (VITAMIN D-3) 25 MCG (1000 UT) CAPS Take by mouth daily.   . Coenzyme Q10 (CO Q 10) 100 MG CAPS Take 1 capsule by mouth daily.  . cycloSPORINE (RESTASIS) 0.05 % ophthalmic emulsion Place 1 drop into both eyes 2 (two) times daily.  Marland Kitchen ibuprofen (ADVIL,MOTRIN) 800 MG tablet Take 800 mg by mouth daily as needed.   Marland Kitchen olmesartan (BENICAR) 5 MG tablet Take by mouth daily.  . Omega-3 Fatty Acids (FISH OIL) 600 MG CAPS Take 600 mg by mouth daily.   . pantoprazole (PROTONIX) 40 MG tablet Take 1 tablet by mouth once daily  . Polyethyl Glycol-Propyl Glycol (SYSTANE OP) Apply 1 drop to eye as needed.   . Probiotic Product (PROBIOTIC-10 PO) Take by mouth daily.   . simvastatin (ZOCOR) 40 MG tablet Take 40 mg by mouth every evening.  . triamcinolone (KENALOG) 0.025  % ointment APPLY OINTMENT TO AFFECTED AREA TWICE DAILY as needed  . VENTOLIN HFA 108 (90 Base) MCG/ACT inhaler Inhale 2 puffs into the lungs every 4 (four) hours as needed.   No facility-administered encounter medications on file as of 10/15/2020.     Objective: Blood pressure 128/78, pulse 90, temperature 98.7 F (37.1 C), temperature source Oral, height 5\' 1"  (1.549 m), weight 141 lb 9.6 oz (64.2 kg). Patient is alert and in no acute distress. Conjunctiva is pink. Sclera is nonicteric Oropharyngeal mucosa is normal. No neck masses or thyromegaly noted. Cardiac exam with regular rhythm normal S1 and S2. No murmur or gallop noted. Lungs are clear to auscultation. Abdomen is symmetrical soft and nontender with organomegaly or masses. No LE edema or clubbing noted.  Assessment:  #1.  Chronic GERD.  Symptoms are well controlled with PPI.  She may consider dropping dose of frequency at some point in future but would not recommend at the present time given that she is to undergo hip surgery next month.  #2.  Patient is average risk for CRC.  Last colonoscopy was in November 2013 with removal of 3 small polyps and these are hyperplastic.  She will be due for screening colonoscopy in November 2023.  Plan:  New prescription for pantoprazole 40 mg p.o. every morning sent to patient's pharmacy for 90 doses with 2 refills. Patient will call office if  pantoprazole stops working or if she has dysphagia. Office visit in 1 year.

## 2020-10-15 NOTE — Patient Instructions (Signed)
Please call office if Pantoprazole stops working
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
## 2020-11-11 ENCOUNTER — Other Ambulatory Visit (HOSPITAL_COMMUNITY): Payer: Self-pay | Admitting: General Practice

## 2020-11-11 DIAGNOSIS — Z1231 Encounter for screening mammogram for malignant neoplasm of breast: Secondary | ICD-10-CM

## 2020-11-13 ENCOUNTER — Other Ambulatory Visit: Payer: Self-pay

## 2020-11-13 ENCOUNTER — Ambulatory Visit (HOSPITAL_COMMUNITY)
Admission: RE | Admit: 2020-11-13 | Discharge: 2020-11-13 | Disposition: A | Discharge status: Home / Self Care | Payer: 59 | Source: Ambulatory Visit | Attending: General Practice | Admitting: General Practice

## 2020-11-13 DIAGNOSIS — Z1231 Encounter for screening mammogram for malignant neoplasm of breast: Secondary | ICD-10-CM | POA: Diagnosis present

## 2020-11-13 NOTE — Patient Instructions (Signed)
DUE TO COVID-19 ONLY ONE VISITOR IS ALLOWED TO COME WITH YOU AND STAY IN THE WAITING ROOM ONLY DURING PRE OP AND PROCEDURE DAY OF SURGERY. THE 1 VISITOR  MAY VISIT WITH YOU AFTER SURGERY IN YOUR PRIVATE ROOM DURING VISITING HOURS ONLY!  YOU NEED TO HAVE A COVID 19 TEST ON: 11/22/20 @               , THIS TEST MUST BE DONE BEFORE SURGERY,  COVID TESTING SITE [place] [address], IT IS ON THE RIGHT GOING OUT [address] APPROXIMATELY  2 MINUTES PAST [employer] ON THE RIGHT. ONCE YOUR COVID TEST IS COMPLETED,  PLEASE BEGIN THE QUARANTINE INSTRUCTIONS AS OUTLINED IN YOUR HANDOUT.                Roneisha Stern   Your procedure is scheduled on: 11/26/20   Report to [facility] Main  Entrance   Report to short stay at: 5:30 AM     Call this number if you have problems the morning of surgery [phone]    Remember:   NO SOLID FOOD AFTER MIDNIGHT THE NIGHT PRIOR TO SURGERY. NOTHING BY MOUTH EXCEPT CLEAR LIQUIDS UNTIL: 4:30 AM . PLEASE FINISH ENSURE DRINK PER SURGEON ORDER  WHICH NEEDS TO BE COMPLETED AT: 4:30 AM .  CLEAR LIQUID DIET   Foods Allowed                                                                     Foods Excluded  Coffee and tea, regular and decaf                             liquids that you cannot  Plain Jell-O any favor except red or purple                                           see through such as: Fruit ices (not with fruit pulp)                                     milk, soups, orange juice  Iced Popsicles                                    All solid food Carbonated beverages, regular and diet                                    Cranberry, grape and apple juices Sports drinks like Gatorade Lightly seasoned clear broth or consume(fat free) Sugar, honey syrup  Sample Menu Breakfast                                Lunch  Supper Cranberry juice                    Beef broth                             Chicken broth Jell-O                                     Grape juice                           Apple juice Coffee or tea                        Jell-O                                      Popsicle                                                Coffee or tea                        Coffee or tea  _____________________________________________________________________   BRUSH YOUR TEETH MORNING OF SURGERY AND RINSE YOUR MOUTH OUT, NO CHEWING GUM CANDY OR MINTS.    Take these medicines the morning of surgery with A SIP OF WATER: pantoprazole.Use inhaler as usual.                               You may not have any metal on your body including hair pins and              piercings  Do not wear jewelry, make-up, lotions, powders or perfumes, deodorant             Do not wear nail polish on your fingernails.  Do not shave  48 hours prior to surgery.         Do not bring valuables to the hospital. [place].  Contacts, dentures or bridgework may not be worn into surgery.  Leave suitcase in the car. After surgery it may be brought to your room.     Patients discharged the day of surgery will not be allowed to drive home. IF YOU ARE HAVING SURGERY AND GOING HOME THE SAME DAY, YOU MUST HAVE AN ADULT TO DRIVE YOU HOME AND BE WITH YOU FOR 24 HOURS. YOU MAY GO HOME BY TAXI OR UBER OR ORTHERWISE, BUT AN ADULT MUST ACCOMPANY YOU HOME AND STAY WITH YOU FOR 24 HOURS.  Name and phone number of your driver:  Special Instructions: N/A              Please read over the following fact sheets you were given: _____________________________________________________________________          [facility] - Preparing for Surgery Before surgery, you can play an important role.  Because skin is not sterile, your skin needs to be  as free of germs as possible.  You can reduce the number of germs on your skin by washing with CHG (chlorahexidine gluconate) soap before  surgery.  CHG is an antiseptic cleaner which kills germs and bonds with the skin to continue killing germs even after washing. Please DO NOT use if you have an allergy to CHG or antibacterial soaps.  If your skin becomes reddened/irritated stop using the CHG and inform your nurse when you arrive at Short Stay. Do not shave (including legs and underarms) for at least 48 hours prior to the first CHG shower.  You may shave your face/neck. Please follow these instructions carefully:  1.  Shower with CHG Soap the night before surgery and the  morning of Surgery.  2.  If you choose to wash your hair, wash your hair first as usual with your  normal  shampoo.  3.  After you shampoo, rinse your hair and body thoroughly to remove the  shampoo.                           4.  Use CHG as you would any other liquid soap.  You can apply chg directly  to the skin and wash                       Gently with a scrungie or clean washcloth.  5.  Apply the CHG Soap to your body ONLY FROM THE NECK DOWN.   Do not use on face/ open                           Wound or open sores. Avoid contact with eyes, ears mouth and genitals (private parts).                       Wash face,  Genitals (private parts) with your normal soap.             6.  Wash thoroughly, paying special attention to the area where your surgery  will be performed.  7.  Thoroughly rinse your body with warm water from the neck down.  8.  DO NOT shower/wash with your normal soap after using and rinsing off  the CHG Soap.                9.  Pat yourself dry with a clean towel.            10.  Wear clean pajamas.            11.  Place clean sheets on your bed the night of your first shower and do not  sleep with pets. Day of Surgery : Do not apply any lotions/deodorants the morning of surgery.  Please wear clean clothes to the hospital/surgery center.  FAILURE TO FOLLOW THESE INSTRUCTIONS MAY RESULT IN THE CANCELLATION OF YOUR SURGERY PATIENT  SIGNATURE_________________________________  NURSE SIGNATURE__________________________________  ________________________________________________________________________   Adam Phenix  An incentive spirometer is a tool that can help keep your lungs clear and active. This tool measures how well you are filling your lungs with each breath. Taking long deep breaths may help reverse or decrease the chance of developing breathing (pulmonary) problems (especially infection) following:  A long period of time when you are unable to move or be active. BEFORE THE PROCEDURE   If the spirometer includes an indicator to show your best  effort, your nurse or respiratory therapist will set it to a desired goal.  If possible, sit up straight or lean slightly forward. Try not to slouch.  Hold the incentive spirometer in an upright position. INSTRUCTIONS FOR USE  1. Sit on the edge of your bed if possible, or sit up as far as you can in bed or on a chair. 2. Hold the incentive spirometer in an upright position. 3. Breathe out normally. 4. Place the mouthpiece in your mouth and seal your lips tightly around it. 5. Breathe in slowly and as deeply as possible, raising the piston or the ball toward the top of the column. 6. Hold your breath for 3-5 seconds or for as long as possible. Allow the piston or ball to fall to the bottom of the column. 7. Remove the mouthpiece from your mouth and breathe out normally. 8. Rest for a few seconds and repeat Steps 1 through 7 at least 10 times every 1-2 hours when you are awake. Take your time and take a few normal breaths between deep breaths. 9. The spirometer may include an indicator to show your best effort. Use the indicator as a goal to work toward during each repetition. 10. After each set of 10 deep breaths, practice coughing to be sure your lungs are clear. If you have an incision (the cut made at the time of surgery), support your incision when coughing  by placing a pillow or rolled up towels firmly against it. Once you are able to get out of bed, walk around indoors and cough well. You may stop using the incentive spirometer when instructed by your caregiver.  RISKS AND COMPLICATIONS  Take your time so you do not get dizzy or light-headed.  If you are in pain, you may need to take or ask for pain medication before doing incentive spirometry. It is harder to take a deep breath if you are having pain. AFTER USE  Rest and breathe slowly and easily.  It can be helpful to keep track of a log of your progress. Your caregiver can provide you with a simple table to help with this. If you are using the spirometer at home, follow these instructions: [place] IF:   You are having difficultly using the spirometer.  You have trouble using the spirometer as often as instructed.  Your pain medication is not giving enough relief while using the spirometer.  You develop fever of 100.5 F (38.1 C) or higher. SEEK IMMEDIATE MEDICAL CARE IF:   You cough up bloody sputum that had not been present before.  You develop fever of 102 F (38.9 C) or greater.  You develop worsening pain at or near the incision site. MAKE SURE YOU:   Understand these instructions.  Will watch your condition.  Will get help right away if you are not doing well or get worse. Document Released: 03/01/2007 Document Revised: 01/11/2012 Document Reviewed: 05/02/2007 [facility] Patient Information 2014 [place], [place].   ________________________________________________________________________

## 2020-11-14 ENCOUNTER — Encounter (HOSPITAL_COMMUNITY)
Admission: RE | Admit: 2020-11-14 | Discharge: 2020-11-14 | Disposition: A | Discharge status: Home / Self Care | Payer: 59 | Source: Ambulatory Visit | Attending: Anesthesiology | Admitting: Anesthesiology

## 2020-11-20 NOTE — Patient Instructions (Signed)
DUE TO COVID-19 ONLY ONE VISITOR IS ALLOWED TO COME WITH YOU AND STAY IN THE WAITING ROOM ONLY DURING PRE OP AND PROCEDURE DAY OF SURGERY. THE 1 VISITOR  MAY VISIT WITH YOU AFTER SURGERY IN YOUR PRIVATE ROOM DURING VISITING HOURS ONLY!  YOU NEED TO HAVE A COVID 19 TEST ON_1/21______ @_______ , THIS TEST MUST BE DONE BEFORE SURGERY,  COVID TESTING SITE [address] [place] [address], IT IS ON THE RIGHT GOING OUT [address] APPROXIMATELY  2 MINUTES PAST [employer] ON THE RIGHT. ONCE YOUR COVID TEST IS COMPLETED,  PLEASE BEGIN THE QUARANTINE INSTRUCTIONS AS OUTLINED IN YOUR HANDOUT.                Quanetta Truss    Your procedure is scheduled on: 11/26/20   Report to [facility] Main  Entrance   Report to Short Stay at 5:30 AM     Call this number if you have problems the morning of surgery [place], NO CHEWING GUM [place].   No food after midnight.    You may have clear liquid until 4:30 AM.    At 4:00 AM drink pre surgery drink.   Nothing by mouth after 4:30 AM.    Take these medicines the morning of surgery with A SIP OF WATER: Pantoprozole, use your eye drops as usual             Use your inhaled if needed and bring it with you to the hospital              You may not have any metal on your body including hair pins and              piercings  Do not wear jewelry, make-up, lotions, powders or perfumes, deodorant             Do not wear nail polish on your fingernails.  Do not shave  48 hours prior to surgery.                 Do not bring valuables to the hospital. [place].  Contacts, dentures or bridgework may not be worn into surgery.      Patients discharged the day of surgery will not be allowed to drive home.   IF YOU ARE HAVING SURGERY AND GOING HOME THE SAME DAY, YOU MUST HAVE AN ADULT TO DRIVE YOU HOME AND BE  WITH YOU FOR 24 HOURS.   YOU MAY GO HOME BY TAXI OR UBER OR ORTHERWISE, BUT AN ADULT MUST ACCOMPANY YOU HOME AND STAY WITH YOU FOR 24 HOURS.  Name and phone number of your driver:  Special Instructions: N/A              Please read over the following fact sheets you were given: _____________________________________________________________________             [facility] - Preparing for Surgery Before surgery, you can play an important role.   Because skin is not sterile, your skin needs to be as free of germs as possible.   You can reduce the number of germs on your skin by washing with CHG (chlorahexidine gluconate) soap before surgery.   CHG is an antiseptic cleaner which kills germs and bonds with the skin  to continue killing germs even after washing. Please DO NOT use if you have an allergy to CHG or antibacterial soaps.   If your skin becomes reddened/irritated stop using the CHG and inform your nurse when you arrive at Short Stay. Do not shave (including legs and underarms) for at least 48 hours prior to the first CHG shower.    Please follow these instructions carefully:  1.  Shower with CHG Soap the night before surgery and the  morning of Surgery.  2.  If you choose to wash your hair, wash your hair first as usual with your  normal  shampoo.  3.  After you shampoo, rinse your hair and body thoroughly to remove the  shampoo.                                        4.  Use CHG as you would any other liquid soap.  You can apply chg directly  to the skin and wash                       Gently with a scrungie or clean washcloth.  5.  Apply the CHG Soap to your body ONLY FROM THE NECK DOWN.   Do not use on face/ open                           Wound or open sores. Avoid contact with eyes, ears mouth and genitals (private parts).                       Wash face,  Genitals (private parts) with your normal soap.             6.  Wash thoroughly, paying special attention to the area where  your surgery  will be performed.  7.  Thoroughly rinse your body with warm water from the neck down.  8.  DO NOT shower/wash with your normal soap after using and rinsing off  the CHG Soap.             9.  Pat yourself dry with a clean towel.            10.  Wear clean pajamas.            11.  Place clean sheets on your bed the night of your first shower and do not  sleep with pets. Day of surgery : Do not apply any lotions/deodorants the morning of surgery.  Please wear clean clothes to the hospital/surgery center.  FAILURE TO FOLLOW THESE INSTRUCTIONS MAY RESULT IN THE CANCELLATION OF YOUR SURGERY PATIENT SIGNATURE_________________________________  NURSE SIGNATURE__________________________________  ________________________________________________________________________   Adam Phenix  An incentive spirometer is a tool that can help keep your lungs clear and active. This tool measures how well you are filling your lungs with each breath. Taking long deep breaths may help reverse or decrease the chance of developing breathing (pulmonary) problems (especially infection) following:  A long period of time when you are unable to move or be active. BEFORE THE PROCEDURE   If the spirometer includes an indicator to show your best effort, your nurse or respiratory therapist will set it to a desired goal.  If possible, sit up straight or lean slightly forward. Try not to slouch.  Hold the incentive spirometer in  an upright position. INSTRUCTIONS FOR USE  1. Sit on the edge of your bed if possible, or sit up as far as you can in bed or on a chair. 2. Hold the incentive spirometer in an upright position. 3. Breathe out normally. 4. Place the mouthpiece in your mouth and seal your lips tightly around it. 5. Breathe in slowly and as deeply as possible, raising the piston or the ball toward the top of the column. 6. Hold your breath for 3-5 seconds or for as long as possible. Allow the  piston or ball to fall to the bottom of the column. 7. Remove the mouthpiece from your mouth and breathe out normally. 8. Rest for a few seconds and repeat Steps 1 through 7 at least 10 times every 1-2 hours when you are awake. Take your time and take a few normal breaths between deep breaths. 9. The spirometer may include an indicator to show your best effort. Use the indicator as a goal to work toward during each repetition. 10. After each set of 10 deep breaths, practice coughing to be sure your lungs are clear. If you have an incision (the cut made at the time of surgery), support your incision when coughing by placing a pillow or rolled up towels firmly against it. Once you are able to get out of bed, walk around indoors and cough well. You may stop using the incentive spirometer when instructed by your caregiver.  RISKS AND COMPLICATIONS  Take your time so you do not get dizzy or light-headed.  If you are in pain, you may need to take or ask for pain medication before doing incentive spirometry. It is harder to take a deep breath if you are having pain. AFTER USE  Rest and breathe slowly and easily.  It can be helpful to keep track of a log of your progress. Your caregiver can provide you with a simple table to help with this. If you are using the spirometer at home, follow these instructions: [place] IF:   You are having difficultly using the spirometer.  You have trouble using the spirometer as often as instructed.  Your pain medication is not giving enough relief while using the spirometer.  You develop fever of 100.5 F (38.1 C) or higher. SEEK IMMEDIATE MEDICAL CARE IF:   You cough up bloody sputum that had not been present before.  You develop fever of 102 F (38.9 C) or greater.  You develop worsening pain at or near the incision site. MAKE SURE YOU:   Understand these instructions.  Will watch your condition.  Will get help right away if you are not  doing well or get worse. Document Released: 03/01/2007 Document Revised: 01/11/2012 Document Reviewed: 05/02/2007 [facility] Patient Information 2014 [place], [place].   ________________________________________________________________________

## 2020-11-21 ENCOUNTER — Telehealth (HOSPITAL_COMMUNITY): Payer: Self-pay | Admitting: Physical Therapy

## 2020-11-21 ENCOUNTER — Encounter (HOSPITAL_COMMUNITY)
Admission: RE | Admit: 2020-11-21 | Discharge: 2020-11-21 | Disposition: A | Discharge status: Home / Self Care | Payer: 59 | Source: Ambulatory Visit | Attending: General Practice | Admitting: General Practice

## 2020-11-21 NOTE — Telephone Encounter (Signed)
pt cancelled appt for 01/26 because her ins did not approve her surgery

## 2020-11-26 ENCOUNTER — Encounter (HOSPITAL_COMMUNITY): Admission: RE | Payer: Self-pay | Source: Home / Self Care

## 2020-11-26 ENCOUNTER — Ambulatory Visit (HOSPITAL_COMMUNITY): Admission: RE | Admit: 2020-11-26 | Payer: 59 | Source: Home / Self Care | Admitting: Orthopedic Surgery

## 2020-11-26 SURGERY — ARTHROPLASTY, HIP, TOTAL, ANTERIOR APPROACH
Anesthesia: Choice | Site: Hip | Laterality: Right

## 2020-11-27 ENCOUNTER — Encounter (HOSPITAL_COMMUNITY): Payer: Self-pay

## 2020-11-27 ENCOUNTER — Ambulatory Visit (HOSPITAL_COMMUNITY): Payer: 59 | Admitting: Physical Therapy

## 2020-11-29 ENCOUNTER — Ambulatory Visit (HOSPITAL_COMMUNITY): Payer: 59 | Admitting: Physical Therapy

## 2020-12-02 ENCOUNTER — Encounter (HOSPITAL_COMMUNITY): Payer: 59 | Admitting: Physical Therapy

## 2020-12-04 ENCOUNTER — Encounter (HOSPITAL_COMMUNITY): Payer: 59 | Admitting: Physical Therapy

## 2020-12-06 ENCOUNTER — Encounter (HOSPITAL_COMMUNITY): Payer: 59

## 2020-12-09 ENCOUNTER — Encounter (HOSPITAL_COMMUNITY): Payer: 59 | Admitting: Physical Therapy

## 2020-12-11 ENCOUNTER — Encounter (HOSPITAL_COMMUNITY): Payer: 59

## 2020-12-13 ENCOUNTER — Encounter (HOSPITAL_COMMUNITY): Payer: 59 | Admitting: Physical Therapy

## 2020-12-16 ENCOUNTER — Encounter (HOSPITAL_COMMUNITY): Payer: 59 | Admitting: Physical Therapy

## 2020-12-18 ENCOUNTER — Encounter (HOSPITAL_COMMUNITY): Payer: 59 | Admitting: Physical Therapy

## 2020-12-20 ENCOUNTER — Encounter (HOSPITAL_COMMUNITY): Payer: 59

## 2020-12-23 ENCOUNTER — Encounter (HOSPITAL_COMMUNITY): Payer: 59 | Admitting: Physical Therapy

## 2020-12-25 ENCOUNTER — Encounter (HOSPITAL_COMMUNITY): Payer: 59 | Admitting: Physical Therapy

## 2020-12-27 ENCOUNTER — Encounter (HOSPITAL_COMMUNITY): Payer: 59 | Admitting: Physical Therapy

## 2020-12-30 ENCOUNTER — Encounter (HOSPITAL_COMMUNITY): Payer: 59 | Admitting: Physical Therapy
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
## 2021-01-27 NOTE — Progress Notes (Signed)
DUE TO COVID-19 ONLY ONE VISITOR IS ALLOWED TO COME WITH YOU AND STAY IN THE WAITING ROOM ONLY DURING PRE OP AND PROCEDURE DAY OF SURGERY. THE 1 VISITOR  MAY VISIT WITH YOU AFTER SURGERY IN YOUR PRIVATE ROOM DURING VISITING HOURS ONLY!  YOU NEED TO HAVE A COVID 19 TEST ON__4/1/22_____ @_______ , THIS TEST MUST BE DONE BEFORE SURGERY,  COVID TESTING SITE [address] [place] [address], IT IS ON THE RIGHT GOING OUT [address] APPROXIMATELY  2 MINUTES PAST [employer] ON THE RIGHT. ONCE YOUR COVID TEST IS COMPLETED,  PLEASE BEGIN THE QUARANTINE INSTRUCTIONS AS OUTLINED IN YOUR HANDOUT.                Chirstine Defrain  01/27/2021   Your procedure is scheduled on:         02/04/21  Report to [facility] Main  Entrance   Report to admitting a       0530t AM     Call this number if you have problems the morning of surgery [phone]    REMEMBER: NO  SOLID FOOD CANDY OR GUM AFTER MIDNIGHT. CLEAR LIQUIDS UNTIL  0430 am        . NOTHING BY MOUTH EXCEPT CLEAR LIQUIDS UNTIL    0430 am     . PLEASE FINISH ENSURE DRINK PER SURGEON ORDER  WHICH NEEDS TO BE COMPLETED AT   0430am    .      CLEAR LIQUID DIET   Foods Allowed                                                                    Coffee and tea, regular and decaf                            Fruit ices (not with fruit pulp)                                      Iced Popsicles                                    Carbonated beverages, regular and diet                                    Cranberry, grape and apple juices Sports drinks like Gatorade Lightly seasoned clear broth or consume(fat free) Sugar, honey syrup ___________________________________________________________________      BRUSH YOUR TEETH MORNING OF SURGERY AND RINSE YOUR MOUTH OUT, NO CHEWING GUM CANDY OR MINTS.     Take these medicines the morning of surgery with A SIP OF WATER:  Inhalers as usual and bring, protonix, eye drops as usual  DO  NOT TAKE ANY DIABETIC MEDICATIONS DAY OF YOUR SURGERY                               You may not have any metal on  your body including hair pins and              piercings  Do not wear jewelry, make-up, lotions, powders or perfumes, deodorant             Do not wear nail polish on your fingernails.  Do not shave  48 hours prior to surgery.              Men may shave face and neck.   Do not bring valuables to the hospital. [place].  Contacts, dentures or bridgework may not be worn into surgery.  Leave suitcase in the car. After surgery it may be brought to your room.     Patients discharged the day of surgery will not be allowed to drive home. IF YOU ARE HAVING SURGERY AND GOING HOME THE SAME DAY, YOU MUST HAVE AN ADULT TO DRIVE YOU HOME AND BE WITH YOU FOR 24 HOURS. YOU MAY GO HOME BY TAXI OR UBER OR ORTHERWISE, BUT AN ADULT MUST ACCOMPANY YOU HOME AND STAY WITH YOU FOR 24 HOURS.  Name and phone number of your driver:  Special Instructions: N/A              Please read over the following fact sheets you were given: _____________________________________________________________________  [facility] - Preparing for Surgery Before surgery, you can play an important role.  Because skin is not sterile, your skin needs to be as free of germs as possible.  You can reduce the number of germs on your skin by washing with CHG (chlorahexidine gluconate) soap before surgery.  CHG is an antiseptic cleaner which kills germs and bonds with the skin to continue killing germs even after washing. Please DO NOT use if you have an allergy to CHG or antibacterial soaps.  If your skin becomes reddened/irritated stop using the CHG and inform your nurse when you arrive at Short Stay. Do not shave (including legs and underarms) for at least 48 hours prior to the first CHG shower.  You may shave your face/neck. Please follow these instructions carefully:  1.  Shower  with CHG Soap the night before surgery and the  morning of Surgery.  2.  If you choose to wash your hair, wash your hair first as usual with your  normal  shampoo.  3.  After you shampoo, rinse your hair and body thoroughly to remove the  shampoo.                           4.  Use CHG as you would any other liquid soap.  You can apply chg directly  to the skin and wash                       Gently with a scrungie or clean washcloth.  5.  Apply the CHG Soap to your body ONLY FROM THE NECK DOWN.   Do not use on face/ open                           Wound or open sores. Avoid contact with eyes, ears mouth and genitals (private parts).                       Wash face,  Genitals (  private parts) with your normal soap.             6.  Wash thoroughly, paying special attention to the area where your surgery  will be performed.  7.  Thoroughly rinse your body with warm water from the neck down.  8.  DO NOT shower/wash with your normal soap after using and rinsing off  the CHG Soap.                9.  Pat yourself dry with a clean towel.            10.  Wear clean pajamas.            11.  Place clean sheets on your bed the night of your first shower and do not  sleep with pets. Day of Surgery : Do not apply any lotions/deodorants the morning of surgery.  Please wear clean clothes to the hospital/surgery center.  FAILURE TO FOLLOW THESE INSTRUCTIONS MAY RESULT IN THE CANCELLATION OF YOUR SURGERY PATIENT SIGNATURE_________________________________  NURSE SIGNATURE__________________________________  ________________________________________________________________________

## 2021-01-29 ENCOUNTER — Encounter (HOSPITAL_COMMUNITY): Payer: Self-pay

## 2021-01-29 ENCOUNTER — Other Ambulatory Visit: Payer: Self-pay

## 2021-01-29 ENCOUNTER — Encounter (HOSPITAL_COMMUNITY)
Admission: RE | Admit: 2021-01-29 | Discharge: 2021-01-29 | Disposition: A | Discharge status: Home / Self Care | Payer: 59 | Source: Ambulatory Visit | Attending: Orthopedic Surgery | Admitting: Orthopedic Surgery

## 2021-01-29 DIAGNOSIS — Z01818 Encounter for other preprocedural examination: Secondary | ICD-10-CM | POA: Insufficient documentation

## 2021-01-29 HISTORY — DX: Unspecified osteoarthritis, unspecified site: M19.90

## 2021-01-29 HISTORY — DX: Essential (primary) hypertension: I10

## 2021-01-29 LAB — CBC
HCT: 43.7 % (ref 36.0–46.0)
Hemoglobin: 14.7 g/dL (ref 12.0–15.0)
MCH: 35.3 pg — ABNORMAL HIGH (ref 26.0–34.0)
MCHC: 33.6 g/dL (ref 30.0–36.0)
MCV: 104.8 fL — ABNORMAL HIGH (ref 80.0–100.0)
Platelets: 217 10*3/uL (ref 150–400)
RBC: 4.17 MIL/uL (ref 3.87–5.11)
RDW: 13 % (ref 11.5–15.5)
WBC: 7.5 10*3/uL (ref 4.0–10.5)
nRBC: 0 % (ref 0.0–0.2)

## 2021-01-29 LAB — BASIC METABOLIC PANEL
Anion gap: 9 (ref 5–15)
BUN: 15 mg/dL (ref 8–23)
CO2: 23 mmol/L (ref 22–32)
Calcium: 9.7 mg/dL (ref 8.9–10.3)
Chloride: 107 mmol/L (ref 98–111)
Creatinine, Ser: 0.82 mg/dL (ref 0.44–1.00)
GFR, Estimated: >60 mL/min (ref >60)
Glucose, Bld: 114 mg/dL — ABNORMAL HIGH (ref 70–99)
Potassium: 4.8 mmol/L (ref 3.5–5.1)
Sodium: 139 mmol/L (ref 135–145)

## 2021-01-29 LAB — SURGICAL PCR SCREEN
MRSA, PCR: NEGATIVE
Staphylococcus aureus: NEGATIVE

## 2021-01-29 NOTE — Progress Notes (Signed)
MORNING OF SURGERY DRINK:   DRINK 1 G2 drink BEFORE YOU LEAVE HOME, DRINK ALL OF THE  G2 DRINK AT ONE TIME.   NO SOLID FOOD AFTER 600 PM THE NIGHT BEFORE YOUR SURGERY. YOU MAY DRINK CLEAR FLUIDS. THE G2 DRINK YOU DRINK BEFORE YOU LEAVE HOME WILL BE THE LAST FLUIDS YOU DRINK BEFORE SURGERY.  PAIN IS EXPECTED AFTER SURGERY AND WILL NOT BE COMPLETELY ELIMINATED. AMBULATION AND TYLENOL WILL HELP REDUCE INCISIONAL AND GAS PAIN. MOVEMENT IS KEY!  YOU ARE EXPECTED TO BE OUT OF BED WITHIN 4 HOURS OF ADMISSION TO YOUR PATIENT ROOM.  SITTING IN THE RECLINER THROUGHOUT THE DAY IS IMPORTANT FOR DRINKING FLUIDS AND MOVING GAS THROUGHOUT THE GI TRACT.  COMPRESSION STOCKINGS SHOULD BE WORN [place] UNLESS YOU ARE WALKING.   INCENTIVE SPIROMETER SHOULD BE USED EVERY HOUR WHILE AWAKE TO DECREASE POST-OPERATIVE COMPLICATIONS SUCH AS PNEUMONIA.  WHEN DISCHARGED HOME, IT IS IMPORTANT TO CONTINUE TO WALK EVERY HOUR AND USE THE INCENTIVE SPIROMETER EVERY HOUR.        MORNING OF SURGERY DRINK:   DRINK 1 G2 drink BEFORE YOU LEAVE HOME, DRINK ALL OF THE  G2 DRINK AT ONE TIME.   NO SOLID FOOD AFTER 600 PM THE NIGHT BEFORE YOUR SURGERY. YOU MAY DRINK CLEAR FLUIDS. THE G2 DRINK YOU DRINK BEFORE YOU LEAVE HOME WILL BE THE LAST FLUIDS YOU DRINK BEFORE SURGERY.  PAIN IS EXPECTED AFTER SURGERY AND WILL NOT BE COMPLETELY ELIMINATED. AMBULATION AND TYLENOL WILL HELP REDUCE INCISIONAL AND GAS PAIN. MOVEMENT IS KEY!  YOU ARE EXPECTED TO BE OUT OF BED WITHIN 4 HOURS OF ADMISSION TO YOUR PATIENT ROOM.  SITTING IN THE RECLINER THROUGHOUT THE DAY IS IMPORTANT FOR DRINKING FLUIDS AND MOVING GAS THROUGHOUT THE GI TRACT.  COMPRESSION STOCKINGS SHOULD BE WORN [place] UNLESS YOU ARE WALKING.   INCENTIVE SPIROMETER SHOULD BE USED EVERY HOUR WHILE AWAKE TO DECREASE POST-OPERATIVE COMPLICATIONS SUCH AS PNEUMONIA.  WHEN DISCHARGED HOME, IT IS IMPORTANT TO CONTINUE TO WALK EVERY HOUR AND USE  THE INCENTIVE SPIROMETER EVERY HOUR.        Anesthesia Review:  PCP: Amy Boyd,PA [facility] in [facility]  Cardiologist : Chest x-ray : EKG :01/29/21  Echo : Stress test: Cardiac Cath :  Activity level:  Can do a flight of stairs without difficulty  Sleep Study/ CPAP : no  Fasting Blood Sugar :      / Checks Blood Sugar -- times a day:   Blood Thinner/ Instructions /Last Dose: ASA / Instructions/ Last Dose :

## 2021-01-31 ENCOUNTER — Other Ambulatory Visit (HOSPITAL_COMMUNITY)
Admission: RE | Admit: 2021-01-31 | Discharge: 2021-01-31 | Disposition: A | Discharge status: Home / Self Care | Payer: 59 | Source: Ambulatory Visit | Attending: Orthopedic Surgery | Admitting: Orthopedic Surgery

## 2021-01-31 DIAGNOSIS — Z20822 Contact with and (suspected) exposure to covid-19: Secondary | ICD-10-CM | POA: Diagnosis not present

## 2021-01-31 DIAGNOSIS — Z01812 Encounter for preprocedural laboratory examination: Secondary | ICD-10-CM | POA: Insufficient documentation

## 2021-01-31 LAB — SARS CORONAVIRUS 2 (TAT 6-24 HRS): SARS Coronavirus 2: NEGATIVE

## 2021-02-03 NOTE — Anesthesia Preprocedure Evaluation (Addendum)
Anesthesia Evaluation  Patient identified by MRN, date of birth, ID band Patient awake    Reviewed: Allergy & Precautions, NPO status , Patient's Chart, lab work & pertinent test results  Airway Mallampati: II  TM Distance: >3 FB Neck ROM: Full    Dental no notable dental hx. (+) Implants, Dental Advisory Given   Pulmonary asthma , Current Smoker and Patient abstained from smoking.,    Pulmonary exam normal breath sounds clear to auscultation       Cardiovascular hypertension, Pt. on medications Normal cardiovascular exam Rhythm:Regular Rate:Normal     Neuro/Psych Anxiety    GI/Hepatic Neg liver ROS, GERD  Controlled and Medicated,  Endo/Other  negative endocrine ROS  Renal/GU Lab Results      Component                Value               Date                      CREATININE               0.82                01/29/2021                BUN                      15                  01/29/2021                NA                       139                 01/29/2021                K                        4.8                 01/29/2021                CL                       107                 01/29/2021                CO2                      23                  01/29/2021                Musculoskeletal  (+) Arthritis ,   Abdominal   Peds  Hematology Lab Results      Component                Value               Date                      WBC  7.5                 01/29/2021                HGB                      14.7                01/29/2021                HCT                      43.7                01/29/2021                MCV                      104.8 (H)           01/29/2021                PLT                      217                 01/29/2021              Anesthesia Other Findings   Reproductive/Obstetrics                            Anesthesia Physical Anesthesia  Plan  ASA: II  Anesthesia Plan: Spinal   Post-op Pain Management:    Induction: Intravenous  PONV Risk Score and Plan: 2 and Treatment may vary due to age or medical condition, Ondansetron and Midazolam  Airway Management Planned: Natural Airway  Additional Equipment: Spinal Drain  Intra-op Plan:   Post-operative Plan:   Informed Consent: I have reviewed the patients History and Physical, chart, labs and discussed the procedure including the risks, benefits and alternatives for the proposed anesthesia with the patient or authorized representative who has indicated his/her understanding and acceptance.     Dental advisory given  Plan Discussed with: CRNA and Anesthesiologist  Anesthesia Plan Comments:        Anesthesia Quick Evaluation

## 2021-02-04 ENCOUNTER — Ambulatory Visit (HOSPITAL_COMMUNITY): Payer: 59

## 2021-02-04 ENCOUNTER — Encounter (HOSPITAL_COMMUNITY): Payer: Self-pay | Admitting: Orthopedic Surgery

## 2021-02-04 ENCOUNTER — Encounter (HOSPITAL_COMMUNITY)
Admission: RE | Disposition: A | Discharge status: Home / Self Care | Payer: Self-pay | Source: Home / Self Care | Attending: Orthopedic Surgery

## 2021-02-04 ENCOUNTER — Ambulatory Visit (HOSPITAL_COMMUNITY): Payer: 59 | Admitting: Anesthesiology

## 2021-02-04 ENCOUNTER — Ambulatory Visit (HOSPITAL_COMMUNITY)
Admission: RE | Admit: 2021-02-04 | Discharge: 2021-02-04 | Disposition: A | Discharge status: Home / Self Care | Payer: 59 | Attending: Orthopedic Surgery | Admitting: Orthopedic Surgery

## 2021-02-04 DIAGNOSIS — Z96641 Presence of right artificial hip joint: Secondary | ICD-10-CM

## 2021-02-04 DIAGNOSIS — Z419 Encounter for procedure for purposes other than remedying health state, unspecified: Secondary | ICD-10-CM

## 2021-02-04 DIAGNOSIS — M1611 Unilateral primary osteoarthritis, right hip: Secondary | ICD-10-CM | POA: Insufficient documentation

## 2021-02-04 DIAGNOSIS — Z79899 Other long term (current) drug therapy: Secondary | ICD-10-CM | POA: Diagnosis not present

## 2021-02-04 HISTORY — PX: TOTAL HIP ARTHROPLASTY: SHX124

## 2021-02-04 SURGERY — ARTHROPLASTY, HIP, TOTAL, ANTERIOR APPROACH
Anesthesia: Spinal | Site: Hip | Laterality: Right

## 2021-02-04 MED ORDER — DEXAMETHASONE SODIUM PHOSPHATE 10 MG/ML IJ SOLN
INTRAMUSCULAR | Status: DC | PRN
  Administered 2021-02-04: 10 mg via INTRAVENOUS

## 2021-02-04 MED ORDER — PHENYLEPHRINE HCL (PRESSORS) 10 MG/ML IV SOLN
INTRAVENOUS | Status: DC | PRN
  Administered 2021-02-04 (×3): 40 ug via INTRAVENOUS

## 2021-02-04 MED ORDER — BUPIVACAINE LIPOSOME 1.3 % IJ SUSP
20.0000 mL | Freq: Once | INTRAMUSCULAR | Status: AC
  Administered 2021-02-04: 20 mL
  Filled 2021-02-04: qty 20

## 2021-02-04 MED ORDER — PHENYLEPHRINE 40 MCG/ML (10ML) SYRINGE FOR IV PUSH (FOR BLOOD PRESSURE SUPPORT)
PREFILLED_SYRINGE | INTRAVENOUS | Status: AC
  Filled 2021-02-04: qty 10

## 2021-02-04 MED ORDER — AMISULPRIDE (ANTIEMETIC) 5 MG/2ML IV SOLN: 10.0000 mg | Freq: Once | INTRAVENOUS | Status: DC | PRN

## 2021-02-04 MED ORDER — OXYCODONE HCL 5 MG/5ML PO SOLN: 5.0000 mg | Freq: Once | ORAL | Status: AC | PRN

## 2021-02-04 MED ORDER — WATER FOR IRRIGATION, STERILE IR SOLN
Status: DC | PRN
  Administered 2021-02-04: 2000 mL

## 2021-02-04 MED ORDER — CEFAZOLIN SODIUM-DEXTROSE 1-4 GM/50ML-% IV SOLN
1.0000 g | Freq: Four times a day (QID) | INTRAVENOUS | Status: DC
Start: 2021-02-04 — End: 2021-02-04

## 2021-02-04 MED ORDER — CHLORHEXIDINE GLUCONATE 0.12 % MT SOLN
15.0000 mL | Freq: Once | OROMUCOSAL | Status: AC
  Administered 2021-02-04: 15 mL via OROMUCOSAL

## 2021-02-04 MED ORDER — PROPOFOL 1000 MG/100ML IV EMUL
INTRAVENOUS | Status: AC
  Filled 2021-02-04: qty 100

## 2021-02-04 MED ORDER — DEXAMETHASONE SODIUM PHOSPHATE 10 MG/ML IJ SOLN: 8.0000 mg | Freq: Once | INTRAMUSCULAR | Status: DC

## 2021-02-04 MED ORDER — ONDANSETRON HCL 4 MG/2ML IJ SOLN
INTRAMUSCULAR | Status: AC
  Filled 2021-02-04: qty 2

## 2021-02-04 MED ORDER — TRANEXAMIC ACID-NACL 1000-0.7 MG/100ML-% IV SOLN
INTRAVENOUS | Status: AC
  Filled 2021-02-04: qty 100

## 2021-02-04 MED ORDER — ORAL CARE MOUTH RINSE: 15.0000 mL | Freq: Once | OROMUCOSAL | Status: AC

## 2021-02-04 MED ORDER — METHOCARBAMOL 500 MG PO TABS
500.0000 mg | ORAL_TABLET | Freq: Four times a day (QID) | ORAL | Status: DC | PRN
Start: 2021-02-04 — End: 2021-02-04

## 2021-02-04 MED ORDER — PROPOFOL 10 MG/ML IV BOLUS
INTRAVENOUS | Status: DC | PRN
  Administered 2021-02-04 (×2): 30 mg via INTRAVENOUS

## 2021-02-04 MED ORDER — CEFAZOLIN SODIUM-DEXTROSE 1-4 GM/50ML-% IV SOLN: 1.0000 g | Freq: Four times a day (QID) | INTRAVENOUS | Status: DC

## 2021-02-04 MED ORDER — TRANEXAMIC ACID-NACL 1000-0.7 MG/100ML-% IV SOLN
1000.0000 mg | Freq: Once | INTRAVENOUS | Status: AC
  Administered 2021-02-04: 1000 mg via INTRAVENOUS

## 2021-02-04 MED ORDER — METHOCARBAMOL 500 MG IVPB - SIMPLE MED: 500.0000 mg | Freq: Four times a day (QID) | INTRAVENOUS | Status: DC | PRN

## 2021-02-04 MED ORDER — BUPIVACAINE IN DEXTROSE 0.75-8.25 % IT SOLN
INTRATHECAL | Status: DC | PRN
  Administered 2021-02-04: 1.6 mL via INTRATHECAL

## 2021-02-04 MED ORDER — TRANEXAMIC ACID-NACL 1000-0.7 MG/100ML-% IV SOLN
1000.0000 mg | INTRAVENOUS | Status: AC
  Administered 2021-02-04: 1000 mg via INTRAVENOUS
  Filled 2021-02-04: qty 100

## 2021-02-04 MED ORDER — OXYCODONE HCL 5 MG PO TABS
5.0000 mg | ORAL_TABLET | Freq: Once | ORAL | Status: AC | PRN
Start: 2021-02-04 — End: 2021-02-04
  Administered 2021-02-04: 5 mg via ORAL

## 2021-02-04 MED ORDER — PROPOFOL 500 MG/50ML IV EMUL
INTRAVENOUS | Status: DC | PRN
  Administered 2021-02-04: 120 ug/kg/min via INTRAVENOUS

## 2021-02-04 MED ORDER — ONDANSETRON HCL 4 MG/2ML IJ SOLN: 4.0000 mg | Freq: Once | INTRAMUSCULAR | Status: DC | PRN

## 2021-02-04 MED ORDER — SODIUM CHLORIDE FLUSH 0.9 % IV SOLN
INTRAVENOUS | Status: DC | PRN
  Administered 2021-02-04: 20 mL

## 2021-02-04 MED ORDER — ONDANSETRON HCL 4 MG/2ML IJ SOLN
INTRAMUSCULAR | Status: DC | PRN
  Administered 2021-02-04: 4 mg via INTRAVENOUS

## 2021-02-04 MED ORDER — ACETAMINOPHEN 10 MG/ML IV SOLN
INTRAVENOUS | Status: AC
  Filled 2021-02-04: qty 100

## 2021-02-04 MED ORDER — OXYCODONE HCL 5 MG PO TABS
ORAL_TABLET | ORAL | Status: AC
  Filled 2021-02-04: qty 1

## 2021-02-04 MED ORDER — ACETAMINOPHEN 10 MG/ML IV SOLN
1000.0000 mg | Freq: Once | INTRAVENOUS | Status: DC | PRN
  Administered 2021-02-04: 1000 mg via INTRAVENOUS

## 2021-02-04 MED ORDER — ACETAMINOPHEN 500 MG PO TABS
1000.0000 mg | ORAL_TABLET | Freq: Once | ORAL | Status: AC
  Administered 2021-02-04: 1000 mg via ORAL
  Filled 2021-02-04: qty 2

## 2021-02-04 MED ORDER — MIDAZOLAM HCL 2 MG/2ML IJ SOLN
INTRAMUSCULAR | Status: DC | PRN
  Administered 2021-02-04: 2 mg via INTRAVENOUS

## 2021-02-04 MED ORDER — HYDROMORPHONE HCL 1 MG/ML IJ SOLN: 0.2500 mg | INTRAMUSCULAR | Status: DC | PRN

## 2021-02-04 MED ORDER — LACTATED RINGERS IV SOLN: INTRAVENOUS | Status: DC

## 2021-02-04 MED ORDER — LACTATED RINGERS IV BOLUS
500.0000 mL | Freq: Once | INTRAVENOUS | Status: AC
  Administered 2021-02-04: 500 mL via INTRAVENOUS

## 2021-02-04 MED ORDER — DEXAMETHASONE SODIUM PHOSPHATE 10 MG/ML IJ SOLN
INTRAMUSCULAR | Status: AC
  Filled 2021-02-04: qty 1

## 2021-02-04 MED ORDER — MIDAZOLAM HCL 2 MG/2ML IJ SOLN
INTRAMUSCULAR | Status: AC
  Filled 2021-02-04: qty 2

## 2021-02-04 MED ORDER — CEFAZOLIN SODIUM-DEXTROSE 2-4 GM/100ML-% IV SOLN
2.0000 g | INTRAVENOUS | Status: AC
  Administered 2021-02-04: 2 g via INTRAVENOUS
  Filled 2021-02-04: qty 100

## 2021-02-04 MED ORDER — 0.9 % SODIUM CHLORIDE (POUR BTL) OPTIME
TOPICAL | Status: DC | PRN
  Administered 2021-02-04: 1000 mL

## 2021-02-04 MED ORDER — MELOXICAM 15 MG PO TABS
15.0000 mg | ORAL_TABLET | Freq: Once | ORAL | Status: DC
  Filled 2021-02-04: qty 1

## 2021-02-04 MED ORDER — ACETAMINOPHEN 325 MG PO TABS
ORAL_TABLET | ORAL | Status: AC
  Filled 2021-02-04: qty 1

## 2021-02-04 MED ORDER — LACTATED RINGERS IV BOLUS
250.0000 mL | Freq: Once | INTRAVENOUS | Status: AC
  Administered 2021-02-04: 250 mL via INTRAVENOUS

## 2021-02-04 SURGICAL SUPPLY — 45 items
APL PRP STRL LF DISP 70% ISPRP (MISCELLANEOUS) ×1
BAG SPEC THK2 15X12 ZIP CLS (MISCELLANEOUS)
BAG ZIPLOCK 12X15 (MISCELLANEOUS) IMPLANT
BLADE SAG 18X100X1.27 (BLADE) ×2 IMPLANT
BLADE SURG SZ10 CARB STEEL (BLADE) IMPLANT
CHLORAPREP W/TINT 26 (MISCELLANEOUS) ×2 IMPLANT
CLSR STERI-STRIP ANTIMIC 1/2X4 (GAUZE/BANDAGES/DRESSINGS) ×2 IMPLANT
COVER PERINEAL POST (MISCELLANEOUS) ×2 IMPLANT
COVER SURGICAL LIGHT HANDLE (MISCELLANEOUS) ×2 IMPLANT
COVER WAND RF STERILE (DRAPES) IMPLANT
DECANTER SPIKE VIAL GLASS SM (MISCELLANEOUS) ×4 IMPLANT
DRAPE IMP U-DRAPE 54X76 (DRAPES) ×2 IMPLANT
DRAPE STERI IOBAN 125X83 (DRAPES) ×2 IMPLANT
DRAPE U-SHAPE 47X51 STRL (DRAPES) ×4 IMPLANT
DRSG MEPILEX BORDER 4X8 (GAUZE/BANDAGES/DRESSINGS) ×2 IMPLANT
DRSG OPSITE POSTOP 4X6 (GAUZE/BANDAGES/DRESSINGS) ×2 IMPLANT
ELECT REM PT RETURN 15FT ADLT (MISCELLANEOUS) ×2 IMPLANT
GLOVE SRG 8 PF TXTR STRL LF DI (GLOVE) ×1 IMPLANT
GLOVE SURG ENC MOIS LTX SZ7.5 (GLOVE) ×4 IMPLANT
GLOVE SURG UNDER POLY LF SZ7.5 (GLOVE) ×2 IMPLANT
GLOVE SURG UNDER POLY LF SZ8 (GLOVE) ×2
GOWN STRL REUS W/TWL LRG LVL3 (GOWN DISPOSABLE) ×2 IMPLANT
GOWN STRL REUS W/TWL XL LVL3 (GOWN DISPOSABLE) ×2 IMPLANT
HEAD BIOLOX HIP 36/-2.5 (Joint) ×1 IMPLANT
HIP BIOLOX HD 36/-2.5 (Joint) ×2 IMPLANT
HOLDER FOLEY CATH W/STRAP (MISCELLANEOUS) IMPLANT
INSERT 0 DEGREE 36 (Miscellaneous) ×2 IMPLANT
KIT TURNOVER KIT A (KITS) ×2 IMPLANT
MANIFOLD NEPTUNE II (INSTRUMENTS) ×2 IMPLANT
NS IRRIG 1000ML POUR BTL (IV SOLUTION) ×2 IMPLANT
PACK ANTERIOR HIP CUSTOM (KITS) ×2 IMPLANT
PROTECTOR NERVE ULNAR (MISCELLANEOUS) ×2 IMPLANT
SCREW HEX LP 6.5X20 (Screw) ×2 IMPLANT
SHELL ACETAB TRIDENT 48 (Shell) ×2 IMPLANT
STEM FEM ACCOLADE S2 37X99X30 (Stem) ×2 IMPLANT
SUT MNCRL AB 3-0 PS2 18 (SUTURE) ×2 IMPLANT
SUT STRATAFIX 0 PDS 27 VIOLET (SUTURE) ×2
SUT VIC AB 0 CT1 36 (SUTURE) ×2 IMPLANT
SUT VIC AB 1 CT1 36 (SUTURE) ×2 IMPLANT
SUT VIC AB 2-0 CT1 27 (SUTURE) ×4
SUT VIC AB 2-0 CT1 TAPERPNT 27 (SUTURE) ×2 IMPLANT
SUTURE STRATFX 0 PDS 27 VIOLET (SUTURE) ×1 IMPLANT
TRAY FOLEY MTR SLVR 16FR STAT (SET/KITS/TRAYS/PACK) IMPLANT
TUBE SUCTION HIGH CAP CLEAR NV (SUCTIONS) ×2 IMPLANT
WATER STERILE IRR 1000ML POUR (IV SOLUTION) ×4 IMPLANT

## 2021-02-04 NOTE — Interval H&P Note (Signed)
History and Physical Interval Note:  02/04/2021 7:33 AM  Barbara Phillips  has presented today for surgery, with the diagnosis of [place].  The various methods of treatment have been discussed with the patient and family. After consideration of risks, benefits and other options for treatment, the patient has consented to  Procedure(s): TOTAL HIP ARTHROPLASTY ANTERIOR APPROACH (Right) as a surgical intervention.  The patient's history has been reviewed, patient examined, no change in status, stable for surgery.  I have reviewed the patient's chart and labs.  Questions were answered to the patient's satisfaction.     Renette Butters

## 2021-02-04 NOTE — Evaluation (Signed)
Physical Therapy Evaluation Patient Details Name: Barbara Phillips MRN: [id number] DOB: [birth year] Today's Date: 02/04/2021   History of Present Illness  Pt is 65 yo female s/p R anterior THA on 02/04/21. Pt has PMH of HTN, HCL, GERD, arthritis.  Clinical Impression  Pt is s/p R THA resulting in the deficits listed below (see PT Problem List). Pt seen in PACU for possible same day discharge.  She was able to demonstrate safe gait, transfers, and stairs with min guard level.  Pt verbalizing understanding of education provided on THA . Pt demonstrates mobility necessary to return home same day from PT perspective but if remains hospitalized Pt will benefit from skilled PT to increase their independence and safety with mobility.    Follow Up Recommendations Follow surgeon's recommendation for DC plan and follow-up therapies;Supervision for mobility/OOB    Equipment Recommendations  Rolling walker with 5" wheels (junior RW)    Recommendations for Other Services       Precautions / Restrictions Precautions Precautions: Fall Restrictions Weight Bearing Restrictions: Yes RLE Weight Bearing: Weight bearing as tolerated      Mobility  Bed Mobility Overal bed mobility: Needs Assistance Bed Mobility: Supine to Sit;Sit to Supine     Supine to sit: Min guard Sit to supine: Min guard   General bed mobility comments: Cues for sequencing, AAROM as needed using L LE, and scooting to EOB.  Pt reports having watched lots of videos online and was very concerned about hip precautions (rotating, spreading legs apart, etc).  Explained anterior vs posterior THA and that she had anterior and does NOT have hip precautions    Transfers Overall transfer level: Needs assistance Equipment used: Rolling walker (2 wheeled) Transfers: Sit to/from Stand Sit to Stand: Min guard         General transfer comment: cues for sequence, WBing status, and R LE management  Ambulation/Gait Ambulation/Gait  assistance: Min guard Gait Distance (Feet): 80 Feet Assistive device: Rolling walker (2 wheeled) Gait Pattern/deviations: Step-to pattern;Decreased weight shift to right;Antalgic Gait velocity: decreased   General Gait Details: Mild antalgic pattern as expected post op.  Education on RW proximity and sequencing.  Steady with RW.  Stairs Stairs: Yes Stairs assistance: Min guard Stair Management: One rail Left;Step to pattern;Sideways;One rail Right Number of Stairs: 3 (3'x2) General stair comments: Rail L up and Rail R down to allow for appropriate sequencing.  Performed sideways with min guard for safety.  Cued first set of 3 steps but pt recalled without cues on 2nd set.  Pt has rails on both sides but too far apart - simulated home environment.  Wheelchair Mobility    Modified Rankin (Stroke Patients Only)       Balance Overall balance assessment: Needs assistance Sitting-balance support: No upper extremity supported Sitting balance-Leahy Scale: Normal     Standing balance support: Bilateral upper extremity supported;No upper extremity supported Standing balance-Leahy Scale: Fair Standing balance comment: RW ambulation but can static stand without AD                             Pertinent Vitals/Pain Pain Assessment: 0-10 Pain Score: 5  Pain Location: R hip Pain Descriptors / Indicators: Discomfort;Burning Pain Intervention(s): Limited activity within patient's tolerance;Monitored during session;Ice applied;Repositioned    Home Living Family/patient expects to be discharged to:: Private residence Living Arrangements: Spouse/significant other Available Help at Discharge: Family;Available 24 hours/day Type of Home: House Home Access: Stairs to enter Entrance  Stairs-Rails: Right;Left Entrance Stairs-Number of Steps: 4 Home Layout: One level Home Equipment: Bedside commode;Shower seat;Walker - 4 wheels;Cane - single point      Prior Function Level of  Independence: Independent               Hand Dominance        Extremity/Trunk Assessment   Upper Extremity Assessment Upper Extremity Assessment: Overall WFL for tasks assessed    Lower Extremity Assessment Lower Extremity Assessment: RLE deficits/detail RLE Deficits / Details: Expected post op changes.  ROM: WFL, MMT: hip 2/5, knee 3/5, ankle 5/5 RLE Sensation: WNL    Cervical / Trunk Assessment Cervical / Trunk Assessment: Normal  Communication   Communication: No difficulties  Cognition Arousal/Alertness: Awake/alert Behavior During Therapy: WFL for tasks assessed/performed Overall Cognitive Status: Within Functional Limits for tasks assessed                                        General Comments General comments (skin integrity, edema, etc.): VSS.  Educated on safe ice use, WBAT, car transfers, HEP, no hip precautions, and transfer techniques. Set pt's walker to correct height.    Exercises Total Joint Exercises Ankle Circles/Pumps: AROM;Both;10 reps;Supine Quad Sets: AROM;Both;10 reps;Supine Heel Slides: Right;5 reps;Supine;AROM (educated on use of gait belt to assist if needed) Hip ABduction/ADduction: AAROM;Right;5 reps;Supine (educated on use of gait belt to assist if needed) Long Arc Quad: AROM;Right;5 reps;Seated   Assessment/Plan    PT Assessment Patient needs continued PT services  PT Problem List Decreased strength;Decreased mobility;Decreased safety awareness;Decreased range of motion;Decreased activity tolerance;Decreased balance;Decreased knowledge of use of DME;Pain;Decreased knowledge of precautions       PT Treatment Interventions DME instruction;Therapeutic activities;Modalities;Gait training;Therapeutic exercise;Patient/family education;Stair training;Balance training;Functional mobility training    PT Goals (Current goals can be found in the Care Plan section)  Acute Rehab PT Goals Patient Stated Goal: return home PT  Goal Formulation: With patient Time For Goal Achievement: 02/18/21 Potential to Achieve Goals: Good    Frequency 7X/week   Barriers to discharge        Co-evaluation               AM-PAC PT "6 Clicks" Mobility  Outcome Measure Help needed turning from your back to your side while in a flat bed without using bedrails?: A Little Help needed moving from lying on your back to sitting on the side of a flat bed without using bedrails?: A Little Help needed moving to and from a bed to a chair (including a wheelchair)?: A Little Help needed standing up from a chair using your arms (e.g., wheelchair or bedside chair)?: A Little Help needed to walk in hospital room?: A Little Help needed climbing 3-5 steps with a railing? : A Little 6 Click Score: 18    End of Session Equipment Utilized During Treatment: Gait belt Activity Tolerance: Patient tolerated treatment well Patient left: in bed;with call bell/phone within reach (in PACU) Nurse Communication: Mobility status PT Visit Diagnosis: Other abnormalities of gait and mobility (R26.89);Muscle weakness (generalized) (M62.81);Pain Pain - Right/Left: Right Pain - part of body: Hip    Time: 1236-1310 PT Time Calculation (min) (ACUTE ONLY): 34 min   Charges:   PT Evaluation $PT Eval Low Complexity: 1 Low PT Treatments $Therapeutic Exercise: 8-22 mins        Abran Richard, PT Acute Rehab Services Pager [phone] [facility] [phone]  Barbara Phillips Barbara Phillips 02/04/2021, 1:19 PM

## 2021-02-04 NOTE — Anesthesia Postprocedure Evaluation (Signed)
Anesthesia Post Note  Patient: Barbara Phillips  Procedure(s) Performed: TOTAL HIP ARTHROPLASTY ANTERIOR APPROACH (Right Hip)     Patient location during evaluation: Nursing Unit Anesthesia Type: Spinal Level of consciousness: oriented and awake and alert Pain management: pain level controlled Vital Signs Assessment: post-procedure vital signs reviewed and stable Respiratory status: spontaneous breathing and respiratory function stable Cardiovascular status: blood pressure returned to baseline and stable Postop Assessment: no headache, no backache, no apparent nausea or vomiting and patient able to bend at knees Anesthetic complications: no   No complications documented.  Last Vitals:  Vitals:   02/04/21 0945 02/04/21 1000  BP: 112/71 120/61  Pulse: 74 72  Resp: 17 (!) 26  Temp:    SpO2: 94% 96%    Last Pain:  Vitals:   02/04/21 1000  TempSrc:   PainSc: 0-No pain                 Barnet Glasgow

## 2021-02-04 NOTE — Addendum Note (Signed)
Addendum  created 02/04/21 1157 by [place] Deziya Amero C, CRNA   Charge Capture section accepted

## 2021-02-04 NOTE — Anesthesia Procedure Notes (Signed)
Spinal  Patient location during procedure: OR Start time: 02/04/2021 7:34 AM End time: 02/04/2021 7:36 AM Reason for block: surgical anesthesia Staffing Performed: resident/CRNA  Resident/CRNA: [place] Kinzy Weyers C, CRNA Preanesthetic Checklist Completed: patient identified, IV checked, site marked, risks and benefits discussed, surgical consent, monitors and equipment checked, pre-op evaluation and timeout performed Spinal Block Patient position: sitting Prep: DuraPrep and site prepped and draped Patient monitoring: heart rate, cardiac monitor, continuous pulse ox and blood pressure Approach: midline Location: L3-4 Injection technique: single-shot Needle Needle type: Pencan  Needle gauge: 24 G Needle length: 9 cm Assessment Sensory level: T4 Events: CSF return Additional Notes IV functioning, monitors applied to pt. Expiration date of kit checked and confirmed to be in date. Sterile prep and drape, hand hygiene and sterile gloved used. Pt was positioned and spine was prepped in sterile fashion. Skin was anesthetized with lidocaine. Free flow of clear CSF obtained prior to injecting local anesthetic into CSF x 1 attempt. Spinal needle aspirated freely following injection. Needle was carefully withdrawn, and pt tolerated procedure well. Loss of motor and sensory on exam post injection.

## 2021-02-04 NOTE — Transfer of Care (Signed)
Immediate Anesthesia Transfer of Care Note  Patient: Barbara Phillips  Procedure(s) Performed: TOTAL HIP ARTHROPLASTY ANTERIOR APPROACH (Right Hip)  Patient Location: PACU  Anesthesia Type:Spinal  Level of Consciousness: awake, alert  and oriented  Airway & Oxygen Therapy: Patient Spontanous Breathing and Patient connected to face mask oxygen  Post-op Assessment: Report given to RN and Post -op Vital signs reviewed and stable  Post vital signs: Reviewed and stable  Last Vitals:  Vitals Value Taken Time  BP 122/71 02/04/21 0931  Temp    Pulse 81 02/04/21 0934  Resp 16 02/04/21 0934  SpO2 93 % 02/04/21 0934  Vitals shown include unvalidated device data.  Last Pain:  Vitals:   02/04/21 0623  TempSrc:   PainSc: 3          Complications: No complications documented.

## 2021-02-04 NOTE — Op Note (Signed)
02/04/2021  8:56 AM  PATIENT:  Barbara Phillips   MRN: [id number]  PRE-OPERATIVE DIAGNOSIS:  DEGENERATIVE JOINT DISEASE RIGHT HIP  POST-OPERATIVE DIAGNOSIS:  DEGENERATIVE JOINT DISEASE RIGHT HIP  PROCEDURE:  Procedure(s): TOTAL HIP ARTHROPLASTY ANTERIOR APPROACH  PREOPERATIVE INDICATIONS:    Barbara Phillips is an 65 y.o. female who has a diagnosis of <principal problem not specified> and elected for surgical management after failing conservative treatment.  The risks benefits and alternatives were discussed with the patient including but not limited to the risks of nonoperative treatment, versus surgical intervention including infection, bleeding, nerve injury, periprosthetic fracture, the need for revision surgery, dislocation, leg length discrepancy, blood clots, cardiopulmonary complications, morbidity, mortality, among others, and they were willing to proceed.     OPERATIVE REPORT     SURGEON:   Renette Butters, MD    ASSISTANT:  Aggie Moats, PA-C, he was present and scrubbed throughout the case, critical for completion in a timely fashion, and for retraction, instrumentation, and closure.     ANESTHESIA:  General    COMPLICATIONS:  None.     COMPONENTS:  Stryker acolade fit femur size 2 with a 36 mm -2.5 head ball and an acetabular shell size 48 with a  polyethylene liner    PROCEDURE IN DETAIL:   The patient was met in the holding area and  identified.  The appropriate hip was identified and marked at the operative site.  The patient was then transported to the OR  and  placed under anesthesia per that record.  At that point, the patient was  placed in the supine position and  secured to the operating room table and all bony prominences padded. He received pre-operative antibiotics    The operative lower extremity was prepped from the iliac crest to the distal leg.  Sterile draping was performed.  Time out was performed prior to incision.      Skin incision was made just 2 cm  lateral to the ASIS  extending in line with the tensor fascia lata. Electrocautery was used to control all bleeders. I dissected down sharply to the fascia of the tensor fascia lata was confirmed that the muscle fibers beneath were running posteriorly. I then incised the fascia over the superficial tensor fascia lata in line with the incision. The fascia was elevated off the anterior aspect of the muscle the muscle was retracted posteriorly and protected throughout the case. I then used electrocautery to incise the tensor fascia lata fascia control and all bleeders. Immediately visible was the fat over top of the anterior neck and capsule.  I removed the anterior fat from the capsule and elevated the rectus muscle off of the anterior capsule. I then removed a large time of capsule. The retractors were then placed over the anterior acetabulum as well as around the superior and inferior neck.  I then made a femoral neck cut. Then used the power corkscrew to remove the femoral head from the acetabulum and thoroughly irrigated the acetabulum. I sized the femoral head.    I then exposed the deep acetabulum, cleared out any tissue including the ligamentum teres.   After adequate visualization, I excised the labrum, and then sequentially reamed.  I then impacted the acetabular implant into place using fluoroscopy for guidance.  Appropriate version and inclination was confirmed clinically matching their bony anatomy, and with fluoroscopy.  I placed a 20 mm screw in the posterior/superio position with an excellent bite.    I then placed the polyethylene  liner in place  I then adducted the leg and released the external rotators from the posterior femur allowing it to be easily delivered up lateral and anterior to the acetabulum for preparation of the femoral canal.    I then prepared the proximal femur using the cookie-cutter and then sequentially reamed and broached.  A trial broach, neck, and head was  utilized, and I reduced the hip and used floroscopy to assess the neck length and femoral implant.  I then impacted the femoral prosthesis into place into the appropriate version. The hip was then reduced and fluoroscopy confirmed appropriate position. Leg lengths were restored.  I then irrigated the hip copiously again with, and repaired the fascia with Vicryl, followed by monocryl for the subcutaneous tissue, Monocryl for the skin, Steri-Strips and sterile gauze. The patient was then awakened and returned to PACU in stable and satisfactory condition. There were no complications.  POST OPERATIVE PLAN: WBAT, DVT px: SCD's/TED, ambulation and chemical dvt px  Barbara Lynch, MD Orthopedic Surgeon [phone]

## 2021-02-05 ENCOUNTER — Other Ambulatory Visit: Payer: Self-pay

## 2021-02-05 ENCOUNTER — Ambulatory Visit (HOSPITAL_COMMUNITY): Payer: 59 | Attending: Orthopedic Surgery | Admitting: Physical Therapy

## 2021-02-05 ENCOUNTER — Encounter (HOSPITAL_COMMUNITY): Payer: Self-pay | Admitting: Physical Therapy

## 2021-02-05 DIAGNOSIS — R2689 Other abnormalities of gait and mobility: Secondary | ICD-10-CM | POA: Insufficient documentation

## 2021-02-05 DIAGNOSIS — M6281 Muscle weakness (generalized): Secondary | ICD-10-CM | POA: Diagnosis present

## 2021-02-05 DIAGNOSIS — M25551 Pain in right hip: Secondary | ICD-10-CM | POA: Diagnosis not present

## 2021-02-05 NOTE — Patient Instructions (Signed)
Access Code: [email or web address] URL: [email or web address] Date: 02/05/2021 Prepared by: Mitzi Hansen Zeyad Delaguila  Exercises Supine Ankle Pumps - 3 x daily - 7 x weekly - 2 sets - 10 reps Supine Quadricep Sets - 3 x daily - 7 x weekly - 10 reps - 10 second hold Hooklying Gluteal Sets - 3 x daily - 7 x weekly - 10 reps - 10 second hold Supine Heel Slide - 3 x daily - 7 x weekly - 10 reps Seated Long Arc Quad - 3 x daily - 7 x weekly - 10 reps - 5-10 second hold

## 2021-02-05 NOTE — Therapy (Signed)
[place], [place], [address] Phone: [phone]   Fax:  [phone]  Physical Therapy Evaluation  Patient Details  Name: Barbara Phillips MRN: [id number] Date of Birth: September 03, 1956 Referring Provider (PT): [name] MD   Encounter Date: 02/05/2021   PT End of Session - 02/05/21 1041    Visit Number 1    Number of Visits 12    Date for PT Re-Evaluation 03/19/21    Authorization Type [employer] ( 30 vl PT/OT/chiro)    Authorization - Visit Number 1    Authorization - Number of Visits 30    Progress Note Due on Visit 10    PT Start Time 1000    PT Stop Time 1038    PT Time Calculation (min) 38 min    Activity Tolerance Patient tolerated treatment well    Behavior During Therapy [facility] for tasks assessed/performed           Past Medical History:  Diagnosis Date  . Arthritis   . GERD (gastroesophageal reflux disease)   . Hypercholesteremia   . Hypertension   . Stress at home 12/11/2013   Has to oversee care of 68 year old mom who lives an hour away  . Trauma 07/29/15    skull fx, hematoma and concussion was hit at work by 600 lb buggy, has eye probelms now    Past Surgical History:  Procedure Laterality Date  . CESAREAN SECTION    . COLONOSCOPY  09/08/2012   Procedure: COLONOSCOPY;  Surgeon: Rogene Houston, MD;  [facility];  Service: Endoscopy;  Laterality: N/A;  830  . correction of lazy eye surgery     . ESOPHAGOGASTRODUODENOSCOPY N/A 08/10/2014   Procedure: ESOPHAGOGASTRODUODENOSCOPY (EGD);  Surgeon: Rogene Houston, MD;  [facility];  Service: Endoscopy;  Laterality: N/A;  1015 - moved to 12:10 - Ann to notify pt  . EYE SURGERY     lasik surgery     There were no vitals filed for this visit.    Subjective Assessment - 02/05/21 1005    Subjective Patient is a 65 y.o. female who presents to physical therapy s/p R anterior THA on 02/04/21. Patient has been having a lot of  pain. She is normally very active. They told her to use her walker for now. They gave her some exercises for her hip to begin. Her main goal is to get her life back. She is having trouble with walking, stairs, transfers. Pain meds are helping.    Limitations Standing;House hold activities;Walking    Patient Stated Goals Get her life back    Currently in Pain? Yes    Pain Score 6     Pain Location Hip    Pain Orientation Right    Pain Descriptors / Indicators Aching;Discomfort    Pain Type Surgical pain    Pain Onset Yesterday    Pain Frequency Constant              [facility] PT Assessment - 02/05/21 0001      Assessment   Medical Diagnosis R anterior THA    Referring Provider (PT) [name] MD    Onset Date/Surgical Date 02/04/21    Next MD Visit 4/13    Prior Therapy none      Precautions   Precautions Anterior Hip    Precaution Comments Patient not aware of any precautions      Restrictions   Weight Bearing Restrictions  No      Balance Screen   Has the patient fallen in the past 6 months No    Has the patient had a decrease in activity level because of a fear of falling?  No    Is the patient reluctant to leave their home because of a fear of falling?  No      Prior Function   Level of Independence Independent    Vocation Retired    Leisure yard work, Editor, commissioning Status Within [employer] for tasks assessed      Observation/Other Assessments   Observations Ambulates with RW, antalgic gait    Focus on Therapeutic Outcomes (FOTO)  42% function      ROM / Strength   AROM / PROM / Strength AROM;Strength      Strength   Strength Assessment Site Hip;Knee;Ankle    Right/Left Hip Right;Left    Right Hip Flexion 2+/5    Left Hip Flexion 5/5    Right/Left Knee Right;Left    Right Knee Flexion 4/5    Right Knee Extension 4-/5    Left Knee Flexion 5/5    Left Knee Extension 5/5    Right/Left Ankle Right;Left    Right Ankle  Dorsiflexion 5/5    Left Ankle Dorsiflexion 5/5      Transfers   Comments labored, relies on LLE      Ambulation/Gait   Ambulation/Gait Yes    Ambulation/Gait Assistance 6: Modified independent (Device/Increase time)    Ambulation Distance (Feet) 100 Feet    Assistive device Rolling walker    Gait Pattern Antalgic;Trunk flexed    Ambulation Surface Level;Indoor    Gait velocity decreased                      Objective measurements completed on examination: See above findings.       [facility] Adult PT Treatment/Exercise - 02/05/21 0001      Exercises   Exercises Knee/Hip      Knee/Hip Exercises: Seated   Long Arc Quad Right;1 set;10 reps      Knee/Hip Exercises: Supine   Quad Sets 10 reps;Right    Quad Sets Limitations 10 second holds    Heel Slides Right;10 reps    Other Supine Knee/Hip Exercises ankle pumps 1x 10    Other Supine Knee/Hip Exercises glute sets 10 x 10 second holds                  PT Education - 02/05/21 1002    Education Details Patient educated on exam findings, POC, scope of PT, HEP, signs of DVT, use of compression garments, walking with RW    Person(s) Educated Patient    Methods Explanation;Demonstration;Handout    Comprehension Verbalized understanding;Returned demonstration            PT Short Term Goals - 02/05/21 1102      PT SHORT TERM GOAL #1   Title Patient will be independent with HEP in order to improve functional outcomes.    Time 3    Period Weeks    Status New    Target Date 02/26/21      PT SHORT TERM GOAL #2   Title Patient will report at least 25% improvement in symptoms for improved quality of life.    Time 3    Period Weeks    Status New    Target Date 02/26/21  PT Long Term Goals - 02/05/21 1103      PT LONG TERM GOAL #1   Title Patient will report at least 75% improvement in symptoms for improved quality of life.    Time 6    Period Weeks    Status New    Target Date  03/19/21      PT LONG TERM GOAL #2   Title Patient will improve FOTO score by at least 25 points in order to indicate improved tolerance to activity.    Time 6    Period Weeks    Status New    Target Date 03/19/21      PT LONG TERM GOAL #3   Title Patient will be able to ambulate at least 400 feet in 2MWT in order to demonstrate improved gait speed for community ambulation.    Time 6    Period Weeks    Status New    Target Date 03/19/21      PT LONG TERM GOAL #4   Title Patient will be able to complete 5x STS in under 11.4 seconds without compensation in order to demonstrate improved LE strength.    Time 6    Period Weeks    Status New    Target Date 03/19/21      PT LONG TERM GOAL #5   Title Patient will be able to navigate stairs with reciprocal pattern without compensation in order to demonstrate improved LE strength.    Time 6    Period Weeks    Status New    Target Date 03/19/21                  Plan - 02/05/21 1047    Clinical Impression Statement Patient is a 65 y.o. female who presents to physical therapy s/p R anterior THA on 02/04/21. Patient presentation consistent with THA. She presents with pain limited deficits in L hip strength, ROM, endurance, gait, transfers, balance, and functional mobility with ADL. She is having to modify and restrict ADL as indicated by FOTO score as well as subjective information and objective measures which is affecting overall participation. Patient will benefit from skilled physical therapy in order to improve function and reduce impairment.    Personal Factors and Comorbidities Comorbidity 1;Behavior Pattern;Time since onset of injury/illness/exacerbation    Comorbidities HTN    Examination-Activity Limitations Locomotion Level;Transfers;Stand;Stairs;Squat;Dressing;Caring for Others;Sleep    Examination-Participation Restrictions Cleaning;Community Activity;Shop;Volunteer;Yard Work;Laundry;Meal Prep;Driving    Stability/Clinical  Decision Making Stable/Uncomplicated    Clinical Decision Making Low    Rehab Potential Good    PT Frequency 2x / week    PT Duration 6 weeks    PT Treatment/Interventions ADLs/Self Care Home Management;Aquatic Therapy;Electrical Stimulation;Cryotherapy;Iontophoresis 4mg /ml Dexamethasone;Moist Heat;Traction;Ultrasound;DME Instruction;Gait training;Stair training;Functional mobility training;Therapeutic activities;Therapeutic exercise;Balance training;Neuromuscular re-education;Patient/family education;Orthotic Fit/Training;Manual techniques;Manual lymph drainage;Compression bandaging;Dry needling;Energy conservation;Splinting;Taping;Vasopneumatic Device;Spinal Manipulations;Joint Manipulations    PT Next Visit Plan begin LE strengthening and hip mobility    PT Home Exercise Plan 4/6 ankle pumps, LAQ, quad sets, supine heel slides, glute set    Consulted and Agree with Plan of Care Patient           Patient will benefit from skilled therapeutic intervention in order to improve the following deficits and impairments:  Abnormal gait,Difficulty walking,Decreased range of motion,Decreased endurance,Increased muscle spasms,Decreased activity tolerance,Pain,Decreased balance,Improper body mechanics,Decreased mobility,Decreased strength,Increased edema  Visit Diagnosis: Pain in right hip  Muscle weakness (generalized)  Other abnormalities of gait and mobility     Problem List Patient  Active Problem List   Diagnosis Date Noted  . Encounter for well woman exam with routine gynecological exam 10/08/2020  . Encounter for screening fecal occult blood testing 10/08/2020  . Screening for colorectal cancer 10/03/2019  . Encounter for gynecological examination with Papanicolaou smear of cervix 10/03/2019  . Well woman exam with routine gynecological exam 01/10/2018  . Anxiety 01/04/2017  . GERD (gastroesophageal reflux disease) 10/14/2015  . Chest pain, unspecified 07/31/2014  . Stress at home  12/11/2013    11:08 AM, 02/05/21 Mearl Latin PT, DPT Physical Therapist at [place], [place], [address] Phone: [phone]   Fax:  [phone]  Name: Barbara Phillips MRN: [id number] Date of Birth: 10-10-1956

## 2021-02-07 ENCOUNTER — Ambulatory Visit (HOSPITAL_COMMUNITY): Payer: 59 | Admitting: Physical Therapy

## 2021-02-07 ENCOUNTER — Other Ambulatory Visit: Payer: Self-pay

## 2021-02-07 DIAGNOSIS — M25551 Pain in right hip: Secondary | ICD-10-CM | POA: Diagnosis not present

## 2021-02-07 DIAGNOSIS — R2689 Other abnormalities of gait and mobility: Secondary | ICD-10-CM

## 2021-02-07 DIAGNOSIS — M6281 Muscle weakness (generalized): Secondary | ICD-10-CM

## 2021-02-07 NOTE — Therapy (Signed)
[place], [place], [address] Phone: [phone]   Fax:  [phone]  Physical Therapy Treatment  Patient Details  Name: Barbara Phillips MRN: [id number] Date of Birth: 07/30/1956 Referring Provider (PT): [name] MD   Encounter Date: 02/07/2021   PT End of Session - 02/07/21 0915    Visit Number 2    Number of Visits 12    Date for PT Re-Evaluation 03/19/21    Authorization Type [employer] ( 30 vl PT/OT/chiro)    Authorization - Visit Number 2    Authorization - Number of Visits 30    Progress Note Due on Visit 10    PT Start Time 0825    PT Stop Time 0910    PT Time Calculation (min) 45 min    Activity Tolerance Patient tolerated treatment well    Behavior During Therapy [facility] for tasks assessed/performed           Past Medical History:  Diagnosis Date  . Arthritis   . GERD (gastroesophageal reflux disease)   . Hypercholesteremia   . Hypertension   . Stress at home 12/11/2013   Has to oversee care of 25 year old mom who lives an hour away  . Trauma 07/29/15    skull fx, hematoma and concussion was hit at work by 600 lb buggy, has eye probelms now    Past Surgical History:  Procedure Laterality Date  . CESAREAN SECTION    . COLONOSCOPY  09/08/2012   Procedure: COLONOSCOPY;  Surgeon: Rogene Houston, MD;  [facility];  Service: Endoscopy;  Laterality: N/A;  830  . correction of lazy eye surgery     . ESOPHAGOGASTRODUODENOSCOPY N/A 08/10/2014   Procedure: ESOPHAGOGASTRODUODENOSCOPY (EGD);  Surgeon: Rogene Houston, MD;  [facility];  Service: Endoscopy;  Laterality: N/A;  1015 - moved to 12:10 - Ann to notify pt  . EYE SURGERY     lasik surgery   . 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   Subjective Assessment - 02/07/21 0828    Subjective Pt has no complaint.  States that she did her exercises three times yesterday and her pain went up to a 9/10.    Limitations Standing;House hold activities;Walking    Patient Stated Goals Get her life back    Currently in Pain? Yes    Pain Score 4     Pain Location Hip    Pain Orientation Right    Pain Descriptors / Indicators Aching    Pain Type Surgical pain    Pain Onset Yesterday    Pain Frequency Constant    Aggravating Factors  exercises    Pain Relieving Factors ice ; tylenol    Effect of Pain on Daily Activities limits                             [facility] Adult PT Treatment/Exercise - 02/07/21 0001      Ambulation/Gait   Ambulation/Gait Yes    Ambulation Distance (Feet) 100 Feet    Assistive device Rolling walker    Gait Comments working on Careers information officer      Exercises   Exercises Knee/Hip      Knee/Hip Exercises:  Standing   Heel Raises 10 reps    Forward Step Up Right;5 reps;Hand Hold: 2;Step Height: 4"    Functional Squat 5 reps    Rocker Board 2 minutes      Knee/Hip Exercises: Seated   Other Seated Knee/Hip Exercises glut set; quad set x 10 each      Manual Therapy   Manual Therapy Edema management    Manual therapy comments retro pumping from knee to hip laterally, pulling fluid from medial to lateral and then pumping again    Edema Management to decrease swelling and pain                  PT Education - 02/07/21 0914    Education Details self edema management    Person(s) Educated Patient    Methods Explanation;Demonstration;Tactile cues;Verbal cues            PT Short Term Goals - 02/07/21 0848      PT SHORT TERM GOAL #1   Title Patient will be independent with HEP in order to improve functional outcomes.    Time 3    Period Weeks    Status On-going    Target Date 02/26/21      PT SHORT TERM GOAL #2   Title Patient will report at least 25% improvement in  symptoms for improved quality of life.    Time 3    Period Weeks    Status On-going    Target Date 02/26/21             PT Long Term Goals - 02/07/21 0849      PT LONG TERM GOAL #1   Title Patient will report at least 75% improvement in symptoms for improved quality of life.    Time 6    Period Weeks    Status On-going      PT LONG TERM GOAL #2   Title Patient will improve FOTO score by at least 25 points in order to indicate improved tolerance to activity.    Time 6    Period Weeks    Status On-going      PT LONG TERM GOAL #3   Title Patient will be able to ambulate at least 400 feet in 2MWT in order to demonstrate improved gait speed for community ambulation.    Time 6    Period Weeks    Status On-going      PT LONG TERM GOAL #4   Title Patient will be able to complete 5x STS in under 11.4 seconds without compensation in order to demonstrate improved LE strength.    Time 6    Period Weeks    Status On-going      PT LONG TERM GOAL #5   Title Patient will be able to navigate stairs with reciprocal pattern without compensation in order to demonstrate improved LE strength.    Time 6    Period Weeks    Status On-going                 Plan - 02/07/21 0915    Clinical Impression Statement Goals and evaluation reviewed with pt.  PT experiencing pain and swelling therefore therapist instructed pt in self manual techniques to decrease swelling.   Instructed in proper heel toe gt. Added exercises as tolerated.    Personal Factors and Comorbidities Comorbidity 1;Behavior Pattern;Time since onset of injury/illness/exacerbation    Comorbidities HTN    Examination-Activity Limitations Locomotion Level;Transfers;Stand;Stairs;Squat;Dressing;Caring for Others;Sleep  Examination-Participation Restrictions Cleaning;Community Activity;Shop;Volunteer;Yard Work;Laundry;Meal Prep;Driving    Stability/Clinical Decision Making Stable/Uncomplicated    Rehab Potential Good    PT  Frequency 2x / week    PT Duration 6 weeks    PT Treatment/Interventions ADLs/Self Care Home Management;Aquatic Therapy;Electrical Stimulation;Cryotherapy;Iontophoresis 4mg /ml Dexamethasone;Moist Heat;Traction;Ultrasound;DME Instruction;Gait training;Stair training;Functional mobility training;Therapeutic activities;Therapeutic exercise;Balance training;Neuromuscular re-education;Patient/family education;Orthotic Fit/Training;Manual techniques;Manual lymph drainage;Compression bandaging;Dry needling;Energy conservation;Splinting;Taping;Vasopneumatic Device;Spinal Manipulations;Joint Manipulations    PT Next Visit Plan continue  LE strengthening and hip mobility    PT Home Exercise Plan 4/6 ankle pumps, LAQ, quad sets, supine heel slides, glute set    Consulted and Agree with Plan of Care Patient           Patient will benefit from skilled therapeutic intervention in order to improve the following deficits and impairments:  Abnormal gait,Difficulty walking,Decreased range of motion,Decreased endurance,Increased muscle spasms,Decreased activity tolerance,Pain,Decreased balance,Improper body mechanics,Decreased mobility,Decreased strength,Increased edema  Visit Diagnosis: Pain in right hip  Muscle weakness (generalized)  Other abnormalities of gait and mobility     Problem List Patient Active Problem List   Diagnosis Date Noted  . Encounter for well woman exam with routine gynecological exam 10/08/2020  . Encounter for screening fecal occult blood testing 10/08/2020  . Screening for colorectal cancer 10/03/2019  . Encounter for gynecological examination with Papanicolaou smear of cervix 10/03/2019  . Well woman exam with routine gynecological exam 01/10/2018  . Anxiety 01/04/2017  . GERD (gastroesophageal reflux disease) 10/14/2015  . Chest pain, unspecified 07/31/2014  . Stress at home 12/11/2013    Rayetta Humphrey, PT CLT [phone] 02/07/2021, 9:18 AM  [place] [address] [place], [place], [address] Phone: [phone]   Fax:  [phone]  Name: Shea Swalley MRN: [id number] Date of Birth: 05-06-1956

## 2021-02-11 ENCOUNTER — Ambulatory Visit (HOSPITAL_COMMUNITY): Payer: 59

## 2021-02-11 ENCOUNTER — Other Ambulatory Visit: Payer: Self-pay

## 2021-02-11 DIAGNOSIS — M25551 Pain in right hip: Secondary | ICD-10-CM | POA: Diagnosis not present

## 2021-02-11 DIAGNOSIS — M6281 Muscle weakness (generalized): Secondary | ICD-10-CM

## 2021-02-11 DIAGNOSIS — R2689 Other abnormalities of gait and mobility: Secondary | ICD-10-CM

## 2021-02-11 NOTE — Patient Instructions (Signed)
Access Code: [email or web address] URL: [email or web address] Date: 02/11/2021 Prepared by: Sherlyn Lees  Exercises Hooklying Clamshell with Resistance - 1 x daily - 7 x weekly - 3 sets - 10 reps - 2 sec hold Clam with Resistance - 1 x daily - 7 x weekly - 3 sets - 10 reps Side Stepping with Resistance at Thighs - 1 x daily - 7 x weekly - 3 sets - 10 reps Sit to Stand with Resistance Around Legs - 1 x daily - 7 x weekly - 3 sets - 10 reps

## 2021-02-11 NOTE — Therapy (Signed)
[place], [place], [address] Phone: [phone]   Fax:  [phone]  Physical Therapy Treatment  Patient Details  Name: Barbara Phillips MRN: [id number] Date of Birth: Mar 19, 1956 Referring Provider (PT): [name] MD   Encounter Date: 02/11/2021   PT End of Session - 02/11/21 0831    Visit Number 3    Number of Visits 12    Date for PT Re-Evaluation 03/19/21    Authorization Type [employer] ( 30 vl PT/OT/chiro)    Authorization - Visit Number 3    Authorization - Number of Visits 30    Progress Note Due on Visit 10    PT Start Time [phone]    PT Stop Time 0900    PT Time Calculation (min) 43 min    Activity Tolerance Patient tolerated treatment well    Behavior During Therapy [facility] for tasks assessed/performed           Past Medical History:  Diagnosis Date  . Arthritis   . GERD (gastroesophageal reflux disease)   . Hypercholesteremia   . Hypertension   . Stress at home 12/11/2013   Has to oversee care of 39 year old mom who lives an hour away  . Trauma 07/29/15    skull fx, hematoma and concussion was hit at work by 600 lb buggy, has eye probelms now    Past Surgical History:  Procedure Laterality Date  . CESAREAN SECTION    . COLONOSCOPY  09/08/2012   Procedure: COLONOSCOPY;  Surgeon: Rogene Houston, MD;  [facility];  Service: Endoscopy;  Laterality: N/A;  830  . correction of lazy eye surgery     . ESOPHAGOGASTRODUODENOSCOPY N/A 08/10/2014   Procedure: ESOPHAGOGASTRODUODENOSCOPY (EGD);  Surgeon: Rogene Houston, MD;  [facility];  Service: Endoscopy;  Laterality: N/A;  1015 - moved to 12:10 - Ann to notify pt  . EYE SURGERY     lasik surgery   . 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   Subjective Assessment - 02/11/21 0826    Subjective Pt has no complaints and reports she is feeling better everyday.  Rates pain in right thigh is 1/10    Limitations Standing;House hold activities;Walking    Patient Stated Goals Get her life back    Currently in Pain? Yes    Pain Score 1     Pain Location Hip    Pain Orientation Right    Pain Descriptors / Indicators Sore    Pain Type Surgical pain    Pain Onset [place] Mylar              [facility] PT Assessment - 02/11/21 0001      Assessment   Medical Diagnosis R anterior THA    Referring Provider (PT) [name] MD    Onset Date/Surgical Date 02/04/21    Next MD Visit 4/13      Precautions   Precautions Anterior Hip    Precaution Comments Patient not aware of any precautions                         [facility] Adult PT Treatment/Exercise - 02/11/21 0001      Knee/Hip Exercises: Standing   Other Standing Knee  Exercises sidestepping with red t-loop around knees x 2 min    Other Standing Knee Exercises standon on airex 6x10 sec eyes closed for prpoprio.      Knee/Hip Exercises: Supine   Quad Sets Strengthening;Right;3 sets;10 reps    Heel Slides AAROM;Right;3 sets;10 reps    Bridges with Clamshell Strengthening;Both;2 sets;10 reps   clamshell with red t-loop   Other Supine Knee/Hip Exercises ball squeeze 3 sec hold 3x10, hip abd isomet 3 sec 3x10      Knee/Hip Exercises: Sidelying   Clams 2x10                  PT Education - 02/11/21 0844    Education Details pt educated in new HEP additions and review of THR precautions relative to anterior approach    Person(s) Educated Patient    Methods Explanation;Demonstration    Comprehension Verbalized understanding;Returned demonstration            PT Short Term Goals - 02/07/21 0848      PT SHORT TERM GOAL #1   Title Patient will be independent with HEP in order to improve functional outcomes.    Time 3    Period Weeks    Status  On-going    Target Date 02/26/21      PT SHORT TERM GOAL #2   Title Patient will report at least 25% improvement in symptoms for improved quality of life.    Time 3    Period Weeks    Status On-going    Target Date 02/26/21             PT Long Term Goals - 02/07/21 0849      PT LONG TERM GOAL #1   Title Patient will report at least 75% improvement in symptoms for improved quality of life.    Time 6    Period Weeks    Status On-going      PT LONG TERM GOAL #2   Title Patient will improve FOTO score by at least 25 points in order to indicate improved tolerance to activity.    Time 6    Period Weeks    Status On-going      PT LONG TERM GOAL #3   Title Patient will be able to ambulate at least 400 feet in 2MWT in order to demonstrate improved gait speed for community ambulation.    Time 6    Period Weeks    Status On-going      PT LONG TERM GOAL #4   Title Patient will be able to complete 5x STS in under 11.4 seconds without compensation in order to demonstrate improved LE strength.    Time 6    Period Weeks    Status On-going      PT LONG TERM GOAL #5   Title Patient will be able to navigate stairs with reciprocal pattern without compensation in order to demonstrate improved LE strength.    Time 6    Period Weeks    Status On-going                 Plan - 02/11/21 0845    Clinical Impression Statement Demonstrating good activity tolerance and return demonstration of HEP activities and able to tolerate increased resistance exercises without adverse effects. Continued POC indicated to improve gait tolerance and perform mobility without compensation or AD    Personal Factors and Comorbidities Comorbidity 1;Behavior Pattern;Time since onset of injury/illness/exacerbation    Comorbidities HTN  Examination-Activity Limitations Locomotion Level;Transfers;Stand;Stairs;Squat;Dressing;Caring for Others;Sleep    Examination-Participation Restrictions Cleaning;Community  Activity;Shop;Volunteer;Yard Work;Laundry;Meal Prep;Driving    Stability/Clinical Decision Making Stable/Uncomplicated    Rehab Potential Good    PT Frequency 2x / week    PT Duration 6 weeks    PT Treatment/Interventions ADLs/Self Care Home Management;Aquatic Therapy;Electrical Stimulation;Cryotherapy;Iontophoresis 4mg /ml Dexamethasone;Moist Heat;Traction;Ultrasound;DME Instruction;Gait training;Stair training;Functional mobility training;Therapeutic activities;Therapeutic exercise;Balance training;Neuromuscular re-education;Patient/family education;Orthotic Fit/Training;Manual techniques;Manual lymph drainage;Compression bandaging;Dry needling;Energy conservation;Splinting;Taping;Vasopneumatic Device;Spinal Manipulations;Joint Manipulations    PT Next Visit Plan continue  LE strengthening and hip mobility    PT Home Exercise Plan 4/6 ankle pumps, LAQ, quad sets, supine heel slides, glute set    Consulted and Agree with Plan of Care Patient           Patient will benefit from skilled therapeutic intervention in order to improve the following deficits and impairments:  Abnormal gait,Difficulty walking,Decreased range of motion,Decreased endurance,Increased muscle spasms,Decreased activity tolerance,Pain,Decreased balance,Improper body mechanics,Decreased mobility,Decreased strength,Increased edema  Visit Diagnosis: Pain in right hip  Muscle weakness (generalized)  Other abnormalities of gait and mobility     Problem List Patient Active Problem List   Diagnosis Date Noted  . Encounter for well woman exam with routine gynecological exam 10/08/2020  . Encounter for screening fecal occult blood testing 10/08/2020  . Screening for colorectal cancer 10/03/2019  . Encounter for gynecological examination with Papanicolaou smear of cervix 10/03/2019  . Well woman exam with routine gynecological exam 01/10/2018  . Anxiety 01/04/2017  . GERD (gastroesophageal reflux disease) 10/14/2015  .  Chest pain, unspecified 07/31/2014  . Stress at home 12/11/2013   9:00 AM, 02/11/21 M. Sherlyn Lees, PT, DPT Physical Therapist- [place] Office Number: [phone]  [place] [address] [place], [place], [address] Phone: [phone]   Fax:  [phone]  Name: Vinessa Macconnell MRN: [id number] Date of Birth: [birth year]

## 2021-02-13 ENCOUNTER — Other Ambulatory Visit: Payer: Self-pay

## 2021-02-13 ENCOUNTER — Ambulatory Visit (HOSPITAL_COMMUNITY): Payer: 59 | Admitting: Physical Therapy

## 2021-02-13 DIAGNOSIS — M6281 Muscle weakness (generalized): Secondary | ICD-10-CM

## 2021-02-13 DIAGNOSIS — M25551 Pain in right hip: Secondary | ICD-10-CM

## 2021-02-13 DIAGNOSIS — R2689 Other abnormalities of gait and mobility: Secondary | ICD-10-CM

## 2021-02-13 NOTE — Therapy (Signed)
[place], [place], [address] Phone: [phone]   Fax:  [phone]  Physical Therapy Treatment  Patient Details  Name: Barbara Phillips MRN: [id number] Date of Birth: [birth year] Referring Provider (PT): [name] MD   Encounter Date: 02/13/2021   PT End of Session - 02/13/21 0903    Visit Number 4    Number of Visits 12    Date for PT Re-Evaluation 03/19/21    Authorization Type [employer] ( 30 vl PT/OT/chiro)    Authorization - Visit Number 4    Authorization - Number of Visits 30    Progress Note Due on Visit 10    PT Start Time 0830    PT Stop Time 0910    PT Time Calculation (min) 40 min    Activity Tolerance Patient tolerated treatment well    Behavior During Therapy [facility] for tasks assessed/performed           Past Medical History:  Diagnosis Date  . Arthritis   . GERD (gastroesophageal reflux disease)   . Hypercholesteremia   . Hypertension   . Stress at home 12/11/2013   Has to oversee care of 30 year old mom who lives an hour away  . Trauma 07/29/15    skull fx, hematoma and concussion was hit at work by 600 lb buggy, has eye probelms now    Past Surgical History:  Procedure Laterality Date  . CESAREAN SECTION    . COLONOSCOPY  09/08/2012   Procedure: COLONOSCOPY;  Surgeon: Rogene Houston, MD;  [facility];  Service: Endoscopy;  Laterality: N/A;  830  . correction of lazy eye surgery     . ESOPHAGOGASTRODUODENOSCOPY N/A 08/10/2014   Procedure: ESOPHAGOGASTRODUODENOSCOPY (EGD);  Surgeon: Rogene Houston, MD;  [facility];  Service: Endoscopy;  Laterality: N/A;  1015 - moved to 12:10 - Ann to notify pt  . EYE SURGERY     lasik surgery   . 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   Subjective Assessment - 02/13/21 0827    Subjective PT states that she is sore today, went to the MD yesterday and she is on no restrictions except for driving and that the therapist will tell her when she can.    Limitations Standing;House hold activities;Walking    Patient Stated Goals Get her life back    Currently in Pain? Yes    Pain Score 2     Pain Location Hip    Pain Orientation Right    Pain Descriptors / Indicators Sore    Pain Type Surgical pain    Pain Onset Yesterday    Pain Frequency Constant    Aggravating Factors  car riding    Pain Relieving Factors ice tylenol    Effect of Pain on Daily Activities limits                             [facility] Adult PT Treatment/Exercise - 02/13/21 0001      Ambulation/Gait   Ambulation/Gait Yes    Ambulation/Gait Assistance 6: Modified independent (Device/Increase time)    Ambulation Distance (Feet) 226 Feet    Assistive device Straight cane    Stairs Yes  Stairs Assistance 5: Supervision    Stair Management Technique One rail Right    Number of Stairs 4    Height of Stairs 7      Exercises   Exercises Knee/Hip      Knee/Hip Exercises: Standing   Heel Raises Both;15 reps    Heel Raises Limitations toe raises 15    Forward Step Up Right;10 reps    Functional Squat 10 reps    Rocker Board 2 minutes    SLS x3 B    Other Standing Knee Exercises sidestepping with red t-loop around knees x 2 min                    PT Short Term Goals - 02/07/21 0848      PT SHORT TERM GOAL #1   Title Patient will be independent with HEP in order to improve functional outcomes.    Time 3    Period Weeks    Status On-going    Target Date 02/26/21      PT SHORT TERM GOAL #2   Title Patient will report at least 25% improvement in symptoms for improved quality of life.    Time 3    Period Weeks    Status On-going    Target Date 02/26/21             PT Long Term Goals - 02/07/21 0849       PT LONG TERM GOAL #1   Title Patient will report at least 75% improvement in symptoms for improved quality of life.    Time 6    Period Weeks    Status On-going      PT LONG TERM GOAL #2   Title Patient will improve FOTO score by at least 25 points in order to indicate improved tolerance to activity.    Time 6    Period Weeks    Status On-going      PT LONG TERM GOAL #3   Title Patient will be able to ambulate at least 400 feet in 2MWT in order to demonstrate improved gait speed for community ambulation.    Time 6    Period Weeks    Status On-going      PT LONG TERM GOAL #4   Title Patient will be able to complete 5x STS in under 11.4 seconds without compensation in order to demonstrate improved LE strength.    Time 6    Period Weeks    Status On-going      PT LONG TERM GOAL #5   Title Patient will be able to navigate stairs with reciprocal pattern without compensation in order to demonstrate improved LE strength.    Time 6    Period Weeks    Status On-going                 Plan - 02/13/21 0904    Clinical Impression Statement Today's session focused on gait training with a cane, stair instruction and general strengthening.  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  Pt states that MD stated that he would inject her LT hip.    Personal Factors and Comorbidities Comorbidity 1;Behavior Pattern;Time since onset of injury/illness/exacerbation    Comorbidities HTN    Examination-Activity Limitations Locomotion Level;Transfers;Stand;Stairs;Squat;Dressing;Caring for Others;Sleep    Examination-Participation Restrictions Cleaning;Community Activity;Shop;Volunteer;Yard Work;Laundry;Meal Prep;Driving    Stability/Clinical Decision Making Stable/Uncomplicated    Rehab Potential Good    PT Frequency 2x /  week    PT Duration 6 weeks    PT Treatment/Interventions ADLs/Self Care Home Management;Aquatic Therapy;Electrical Stimulation;Cryotherapy;Iontophoresis 4mg /ml  Dexamethasone;Moist Heat;Traction;Ultrasound;DME Instruction;Gait training;Stair training;Functional mobility training;Therapeutic activities;Therapeutic exercise;Balance training;Neuromuscular re-education;Patient/family education;Orthotic Fit/Training;Manual techniques;Manual lymph drainage;Compression bandaging;Dry needling;Energy conservation;Splinting;Taping;Vasopneumatic Device;Spinal Manipulations;Joint Manipulations    PT Next Visit Plan continue  LE strengthening and hip mobility    PT Home Exercise Plan 4/6 ankle pumps, LAQ, quad sets, supine heel slides, glute set    Consulted and Agree with Plan of Care Patient           Patient will benefit from skilled therapeutic intervention in order to improve the following deficits and impairments:  Abnormal gait,Difficulty walking,Decreased range of motion,Decreased endurance,Increased muscle spasms,Decreased activity tolerance,Pain,Decreased balance,Improper body mechanics,Decreased mobility,Decreased strength,Increased edema  Visit Diagnosis: Pain in right hip  Muscle weakness (generalized)  Other abnormalities of gait and mobility     Problem List Patient Active Problem List   Diagnosis Date Noted  . Encounter for well woman exam with routine gynecological exam 10/08/2020  . Encounter for screening fecal occult blood testing 10/08/2020  . Screening for colorectal cancer 10/03/2019  . Encounter for gynecological examination with Papanicolaou smear of cervix 10/03/2019  . Well woman exam with routine gynecological exam 01/10/2018  . Anxiety 01/04/2017  . GERD (gastroesophageal reflux disease) 10/14/2015  . Chest pain, unspecified 07/31/2014  . Stress at home 12/11/2013  Rayetta Humphrey, PT CLT [phone] 02/13/2021, 9:14 AM  [place] [address] [place], [place], [address] Phone: [phone]   Fax:  [phone]  Name: Anet Logsdon MRN: [id number] Date of Birth:  1956/06/10

## 2021-02-18 ENCOUNTER — Other Ambulatory Visit: Payer: Self-pay

## 2021-02-18 ENCOUNTER — Ambulatory Visit (HOSPITAL_COMMUNITY): Payer: 59

## 2021-02-18 ENCOUNTER — Encounter (HOSPITAL_COMMUNITY): Payer: Self-pay

## 2021-02-18 DIAGNOSIS — M25551 Pain in right hip: Secondary | ICD-10-CM

## 2021-02-18 DIAGNOSIS — M6281 Muscle weakness (generalized): Secondary | ICD-10-CM

## 2021-02-18 DIAGNOSIS — R2689 Other abnormalities of gait and mobility: Secondary | ICD-10-CM

## 2021-02-18 NOTE — Therapy (Signed)
[place], [place], [address] Phone: [phone]   Fax:  [phone]  Physical Therapy Treatment  Patient Details  Name: Barbara Phillips MRN: [id number] Date of Birth: [birth year] Referring Provider (PT): [name] MD   Encounter Date: 02/18/2021   PT End of Session - 02/18/21 1326    Visit Number 5    Number of Visits 12    Date for PT Re-Evaluation 03/19/21    Authorization Type [employer] ( 30 vl PT/OT/chiro)    Authorization - Visit Number 5    Authorization - Number of Visits 30    Progress Note Due on Visit 10    PT Start Time 1300    PT Stop Time 1345    PT Time Calculation (min) 45 min    Activity Tolerance Patient tolerated treatment well    Behavior During Therapy [facility] for tasks assessed/performed           Past Medical History:  Diagnosis Date  . Arthritis   . GERD (gastroesophageal reflux disease)   . Hypercholesteremia   . Hypertension   . Stress at home 12/11/2013   Has to oversee care of 64 year old mom who lives an hour away  . Trauma 07/29/15    skull fx, hematoma and concussion was hit at work by 600 lb buggy, has eye probelms now    Past Surgical History:  Procedure Laterality Date  . CESAREAN SECTION    . COLONOSCOPY  09/08/2012   Procedure: COLONOSCOPY;  Surgeon: Rogene Houston, MD;  [facility];  Service: Endoscopy;  Laterality: N/A;  830  . correction of lazy eye surgery     . ESOPHAGOGASTRODUODENOSCOPY N/A 08/10/2014   Procedure: ESOPHAGOGASTRODUODENOSCOPY (EGD);  Surgeon: Rogene Houston, MD;  [facility];  Service: Endoscopy;  Laterality: N/A;  1015 - moved to 12:10 - Ann to notify pt  . EYE SURGERY     lasik surgery   . 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   Subjective Assessment - 02/18/21 1316    Subjective Reports she is feeling better and stronger and has no pain in RLE other than soreness and she is feeling more confident with walking without AD    Limitations Standing;House hold activities;Walking    Patient Stated Goals Get her life back    Pain Onset Yesterday                             [facility] Adult PT Treatment/Exercise - 02/18/21 0001      Ambulation/Gait   Ambulation/Gait Yes    Ambulation/Gait Assistance 6: Modified independent (Device/Increase time)    Ambulation Distance (Feet) 226 Feet    Assistive device Straight cane    Gait Pattern Step-through pattern    Gait Comments occasional cues in cane placement      Knee/Hip Exercises: Stretches   Gastroc Stretch Both;2 reps;60 seconds    Gastroc Stretch Limitations slantboard      Knee/Hip Exercises: Machines for Strengthening   Cybex Knee Flexion 4 plates 2x10      Knee/Hip Exercises: Standing   Knee Flexion Strengthening;Right;2 sets;10 reps    Knee Flexion Limitations 3 lbs    Hip Flexion  Stengthening;Both;2 sets   2 min stair taps 4" step 3 lbs ankle weights   Rocker Board 2 minutes   2x30 sec ant-post; 2x30 lateral   Gait Training "Freeze frame" gait cycle    Other Standing Knee Exercises sidestepping with 3 lbs x 2 min    Other Standing Knee Exercises airex pad 3x10 sec eyes open/closed      Knee/Hip Exercises: Seated   Long Arc Quad Strengthening;Right;3 sets;10 reps    Long Arc Quad Weight 3 lbs.                    PT Short Term Goals - 02/07/21 0848      PT SHORT TERM GOAL #1   Title Patient will be independent with HEP in order to improve functional outcomes.    Time 3    Period Weeks    Status On-going    Target Date 02/26/21      PT SHORT TERM GOAL #2   Title Patient will report at least 25% improvement in symptoms for improved quality of life.    Time 3    Period Weeks    Status On-going    Target Date  02/26/21             PT Long Term Goals - 02/07/21 0849      PT LONG TERM GOAL #1   Title Patient will report at least 75% improvement in symptoms for improved quality of life.    Time 6    Period Weeks    Status On-going      PT LONG TERM GOAL #2   Title Patient will improve FOTO score by at least 25 points in order to indicate improved tolerance to activity.    Time 6    Period Weeks    Status On-going      PT LONG TERM GOAL #3   Title Patient will be able to ambulate at least 400 feet in 2MWT in order to demonstrate improved gait speed for community ambulation.    Time 6    Period Weeks    Status On-going      PT LONG TERM GOAL #4   Title Patient will be able to complete 5x STS in under 11.4 seconds without compensation in order to demonstrate improved LE strength.    Time 6    Period Weeks    Status On-going      PT LONG TERM GOAL #5   Title Patient will be able to navigate stairs with reciprocal pattern without compensation in order to demonstrate improved LE strength.    Time 6    Period Weeks    Status On-going                 Plan - 02/18/21 1346    Clinical Impression Statement Good activity tolerance and improve ambulation capabilities with minimal antlagia appreciated with RW and some cues required for cane training. Continued POC indicated to improve RLE strength and progress to ambulation without AD    Personal Factors and Comorbidities Comorbidity 1;Behavior Pattern;Time since onset of injury/illness/exacerbation    Comorbidities HTN    Examination-Activity Limitations Locomotion Level;Transfers;Stand;Stairs;Squat;Dressing;Caring for Others;Sleep    Examination-Participation Restrictions Cleaning;Community Activity;Shop;Volunteer;Yard Work;Laundry;Meal Prep;Driving    Stability/Clinical Decision Making Stable/Uncomplicated    Rehab Potential Good    PT Frequency 2x / week    PT Duration 6 weeks    PT Treatment/Interventions ADLs/Self Care Home  Management;Aquatic Therapy;Electrical Stimulation;Cryotherapy;Iontophoresis 4mg /ml Dexamethasone;Moist Heat;Traction;Ultrasound;DME  Instruction;Gait training;Stair training;Functional mobility training;Therapeutic activities;Therapeutic exercise;Balance training;Neuromuscular re-education;Patient/family education;Orthotic Fit/Training;Manual techniques;Manual lymph drainage;Compression bandaging;Dry needling;Energy conservation;Splinting;Taping;Vasopneumatic Device;Spinal Manipulations;Joint Manipulations    PT Next Visit Plan continue  LE strengthening and hip mobility    PT Home Exercise Plan 4/6 ankle pumps, LAQ, quad sets, supine heel slides, glute set    Consulted and Agree with Plan of Care Patient           Patient will benefit from skilled therapeutic intervention in order to improve the following deficits and impairments:  Abnormal gait,Difficulty walking,Decreased range of motion,Decreased endurance,Increased muscle spasms,Decreased activity tolerance,Pain,Decreased balance,Improper body mechanics,Decreased mobility,Decreased strength,Increased edema  Visit Diagnosis: Pain in right hip  Muscle weakness (generalized)  Other abnormalities of gait and mobility     Problem List Patient Active Problem List   Diagnosis Date Noted  . Encounter for well woman exam with routine gynecological exam 10/08/2020  . Encounter for screening fecal occult blood testing 10/08/2020  . Screening for colorectal cancer 10/03/2019  . Encounter for gynecological examination with Papanicolaou smear of cervix 10/03/2019  . Well woman exam with routine gynecological exam 01/10/2018  . Anxiety 01/04/2017  . GERD (gastroesophageal reflux disease) 10/14/2015  . Chest pain, unspecified 07/31/2014  . Stress at home 12/11/2013   1:47 PM, 02/18/21 M. Sherlyn Lees, PT, DPT Physical Therapist- [place] Office Number: [phone]  [place], [place], [address] Phone: [phone]   Fax:  [phone]  Name: Montine Hight MRN: [id number] Date of Birth: 1956-01-24

## 2021-02-20 ENCOUNTER — Other Ambulatory Visit: Payer: Self-pay

## 2021-02-20 ENCOUNTER — Ambulatory Visit (HOSPITAL_COMMUNITY): Payer: 59 | Admitting: Physical Therapy

## 2021-02-20 ENCOUNTER — Encounter (HOSPITAL_COMMUNITY): Payer: Self-pay | Admitting: Physical Therapy

## 2021-02-20 DIAGNOSIS — M6281 Muscle weakness (generalized): Secondary | ICD-10-CM

## 2021-02-20 DIAGNOSIS — M25551 Pain in right hip: Secondary | ICD-10-CM | POA: Diagnosis not present

## 2021-02-20 DIAGNOSIS — R2689 Other abnormalities of gait and mobility: Secondary | ICD-10-CM

## 2021-02-20 NOTE — Patient Instructions (Signed)
Access Code: [email or web address] URL: [email or web address] Date: 02/20/2021 Prepared by: Mitzi Hansen Kalijah Westfall  Exercises Prone Hip Extension - 1 x daily - 7 x weekly - 2 sets - 10 reps Sidelying Hip Abduction - 1 x daily - 7 x weekly - 2 sets - 10 reps

## 2021-02-20 NOTE — Therapy (Signed)
[place], [place], [address] Phone: [phone]   Fax:  [phone]  Physical Therapy Treatment  Patient Details  Name: Barbara Phillips MRN: [id number] Date of Birth: 1956-07-09 Referring Provider (PT): [name] MD   Encounter Date: 02/20/2021   PT End of Session - 02/20/21 0827    Visit Number 6    Number of Visits 12    Date for PT Re-Evaluation 03/19/21    Authorization Type [employer] ( 30 vl PT/OT/chiro)    Authorization - Visit Number 6    Authorization - Number of Visits 30    Progress Note Due on Visit 10    PT Start Time 0829    PT Stop Time 0909    PT Time Calculation (min) 40 min    Activity Tolerance Patient tolerated treatment well    Behavior During Therapy [facility] for tasks assessed/performed           Past Medical History:  Diagnosis Date  . Arthritis   . GERD (gastroesophageal reflux disease)   . Hypercholesteremia   . Hypertension   . Stress at home 12/11/2013   Has to oversee care of 84 year old mom who lives an hour away  . Trauma 07/29/15    skull fx, hematoma and concussion was hit at work by 600 lb buggy, has eye probelms now    Past Surgical History:  Procedure Laterality Date  . CESAREAN SECTION    . COLONOSCOPY  09/08/2012   Procedure: COLONOSCOPY;  Surgeon: Rogene Houston, MD;  [facility];  Service: Endoscopy;  Laterality: N/A;  830  . correction of lazy eye surgery     . ESOPHAGOGASTRODUODENOSCOPY N/A 08/10/2014   Procedure: ESOPHAGOGASTRODUODENOSCOPY (EGD);  Surgeon: Rogene Houston, MD;  [facility];  Service: Endoscopy;  Laterality: N/A;  1015 - moved to 12:10 - Ann to notify pt  . EYE SURGERY     lasik surgery   . 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   Subjective Assessment - 02/20/21 0829    Subjective Patient states some soreness in her leg. She has been walking with cane.    Limitations Standing;House hold activities;Walking    Patient Stated Goals Get her life back    Currently in Pain? Yes    Pain Score 1     Pain Location Hip    Pain Orientation Right    Pain Descriptors / Indicators Sore    Pain Type Surgical pain    Pain Onset Yesterday                             [facility] Adult PT Treatment/Exercise - 02/20/21 0001      Knee/Hip Exercises: Standing   Knee Flexion Strengthening;Right;2 sets;10 reps    Knee Flexion Limitations 3 lbs    Hip Flexion Both;2 sets;10 reps    Hip Flexion Limitations 3#    Lateral Step Up Right;2 sets;10 reps;Hand Hold: 1;Step Height: 4"    Forward Step Up Right;10 reps;Hand Hold: 1;Step Height: 4";2 sets    Functional Squat 2 sets;10 reps    Other Standing Knee Exercises sidestepping with2x 10 feet bilateral in mini squad    Other Standing  Knee Exercises tandem stance on blue foam 2x 30 seconds bilateral      Knee/Hip Exercises: Sidelying   Hip ABduction Right;2 sets;10 reps      Knee/Hip Exercises: Prone   Hip Extension Right;2 sets;10 reps      Manual Therapy   Manual Therapy Soft tissue mobilization    Manual therapy comments completed independently from all other aspects of treatmetn    Soft tissue mobilization self STM to quads, hip flexor with massage stick                  PT Education - 02/20/21 0829    Education Details HEP, exercise mechanics    Person(s) Educated Patient    Methods Explanation;Demonstration    Comprehension Verbalized understanding;Returned demonstration            PT Short Term Goals - 02/07/21 0848      PT SHORT TERM GOAL #1   Title Patient will be independent with HEP in order to improve functional outcomes.    Time 3    Period Weeks    Status On-going    Target Date 02/26/21      PT SHORT TERM GOAL #2    Title Patient will report at least 25% improvement in symptoms for improved quality of life.    Time 3    Period Weeks    Status On-going    Target Date 02/26/21             PT Long Term Goals - 02/07/21 0849      PT LONG TERM GOAL #1   Title Patient will report at least 75% improvement in symptoms for improved quality of life.    Time 6    Period Weeks    Status On-going      PT LONG TERM GOAL #2   Title Patient will improve FOTO score by at least 25 points in order to indicate improved tolerance to activity.    Time 6    Period Weeks    Status On-going      PT LONG TERM GOAL #3   Title Patient will be able to ambulate at least 400 feet in 2MWT in order to demonstrate improved gait speed for community ambulation.    Time 6    Period Weeks    Status On-going      PT LONG TERM GOAL #4   Title Patient will be able to complete 5x STS in under 11.4 seconds without compensation in order to demonstrate improved LE strength.    Time 6    Period Weeks    Status On-going      PT LONG TERM GOAL #5   Title Patient will be able to navigate stairs with reciprocal pattern without compensation in order to demonstrate improved LE strength.    Time 6    Period Weeks    Status On-going                 Plan - 02/20/21 2993    Clinical Impression Statement Patient with soreness and hyperactive musculature in R hip and quad at beginning of session. Patient shown self STM with massage stick which she states improves symptoms and she is educated on performing at home. Patient given cueing for posture and decreasing UE support with most exercises completed. Patient demonstrates very good squatting mechanics with UE support at parallel bars. Patient with impaired weight bearing on RLE with lateral stepping which improves with unilateral UE support.  Patient with min/mod sway with tandem stance requiring intermittent HHA. Patient will continue to benefit from skilled physical therapy in  order to reduce impairment and improve function.    Personal Factors and Comorbidities Comorbidity 1;Behavior Pattern;Time since onset of injury/illness/exacerbation    Comorbidities HTN    Examination-Activity Limitations Locomotion Level;Transfers;Stand;Stairs;Squat;Dressing;Caring for Others;Sleep    Examination-Participation Restrictions Cleaning;Community Activity;Shop;Volunteer;Yard Work;Laundry;Meal Prep;Driving    Stability/Clinical Decision Making Stable/Uncomplicated    Rehab Potential Good    PT Frequency 2x / week    PT Duration 6 weeks    PT Treatment/Interventions ADLs/Self Care Home Management;Aquatic Therapy;Electrical Stimulation;Cryotherapy;Iontophoresis 4mg /ml Dexamethasone;Moist Heat;Traction;Ultrasound;DME Instruction;Gait training;Stair training;Functional mobility training;Therapeutic activities;Therapeutic exercise;Balance training;Neuromuscular re-education;Patient/family education;Orthotic Fit/Training;Manual techniques;Manual lymph drainage;Compression bandaging;Dry needling;Energy conservation;Splinting;Taping;Vasopneumatic Device;Spinal Manipulations;Joint Manipulations    PT Next Visit Plan continue  LE strengthening and hip mobility    PT Home Exercise Plan 4/6 ankle pumps, LAQ, quad sets, supine heel slides, glute set 4/21 Hip abd and ext    Consulted and Agree with Plan of Care Patient           Patient will benefit from skilled therapeutic intervention in order to improve the following deficits and impairments:  Abnormal gait,Difficulty walking,Decreased range of motion,Decreased endurance,Increased muscle spasms,Decreased activity tolerance,Pain,Decreased balance,Improper body mechanics,Decreased mobility,Decreased strength,Increased edema  Visit Diagnosis: Pain in right hip  Muscle weakness (generalized)  Other abnormalities of gait and mobility     Problem List Patient Active Problem List   Diagnosis Date Noted  . Encounter for well woman exam  with routine gynecological exam 10/08/2020  . Encounter for screening fecal occult blood testing 10/08/2020  . Screening for colorectal cancer 10/03/2019  . Encounter for gynecological examination with Papanicolaou smear of cervix 10/03/2019  . Well woman exam with routine gynecological exam 01/10/2018  . Anxiety 01/04/2017  . GERD (gastroesophageal reflux disease) 10/14/2015  . Chest pain, unspecified 07/31/2014  . Stress at home 12/11/2013    9:13 AM, 02/20/21 Mearl Latin PT, DPT Physical Therapist at [place], [place], [address] Phone: [phone]   Fax:  [phone]  Name: Barbara Phillips MRN: [id number] Date of Birth: February 13, 1956

## 2021-02-25 ENCOUNTER — Encounter (HOSPITAL_COMMUNITY): Payer: Self-pay

## 2021-02-25 ENCOUNTER — Ambulatory Visit (HOSPITAL_COMMUNITY): Payer: 59

## 2021-02-25 ENCOUNTER — Other Ambulatory Visit: Payer: Self-pay

## 2021-02-25 DIAGNOSIS — M25551 Pain in right hip: Secondary | ICD-10-CM | POA: Diagnosis not present

## 2021-02-25 DIAGNOSIS — M6281 Muscle weakness (generalized): Secondary | ICD-10-CM

## 2021-02-25 DIAGNOSIS — R2689 Other abnormalities of gait and mobility: Secondary | ICD-10-CM

## 2021-02-25 NOTE — Therapy (Signed)
[address] [place], [place], [address] Phone: [phone]   Fax:  [phone]  Physical Therapy Treatment  Patient Details  Name: Barbara Phillips MRN: [id number] Date of Birth: 1955-12-27 Referring Provider (PT): [name] MD   Encounter Date: 02/25/2021   PT End of Session - 02/25/21 0827    Visit Number 7    Number of Visits 12    Date for PT Re-Evaluation 03/19/21    Authorization Type [employer] ( 30 vl PT/OT/chiro)    Authorization - Visit Number 7    Authorization - Number of Visits 30    Progress Note Due on Visit 10    PT Start Time [phone]   late arrival   PT Stop Time 0900    PT Time Calculation (min) 37 min    Activity Tolerance Patient tolerated treatment well    Behavior During Therapy [facility] for tasks assessed/performed           Past Medical History:  Diagnosis Date  . Arthritis   . GERD (gastroesophageal reflux disease)   . Hypercholesteremia   . Hypertension   . Stress at home 12/11/2013   Has to oversee care of 87 year old mom who lives an hour away  . Trauma 07/29/15    skull fx, hematoma and concussion was hit at work by 600 lb buggy, has eye probelms now    Past Surgical History:  Procedure Laterality Date  . CESAREAN SECTION    . COLONOSCOPY  09/08/2012   Procedure: COLONOSCOPY;  Surgeon: Rogene Houston, MD;  [facility];  Service: Endoscopy;  Laterality: N/A;  830  . correction of lazy eye surgery     . ESOPHAGOGASTRODUODENOSCOPY N/A 08/10/2014   Procedure: ESOPHAGOGASTRODUODENOSCOPY (EGD);  Surgeon: Rogene Houston, MD;  [facility];  Service: Endoscopy;  Laterality: N/A;  1015 - moved to 12:10 - Ann to notify pt  . EYE SURGERY     lasik surgery   . 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   Subjective Assessment - 02/25/21 0828    Subjective Pt reports doing a lot of walking and walking up/down stairs alot for exercise    Limitations Standing;House hold activities;Walking    Patient Stated Goals Get her life back    Currently in Pain? Yes    Pain Score 1     Pain Location Hip    Pain Orientation Right    Pain Descriptors / Indicators Sore    Pain Onset [facility]              [facility] PT Assessment - 02/25/21 0001      Assessment   Medical Diagnosis R anterior THA    Referring Provider (PT) [name] MD    Onset Date/Surgical Date 02/04/21    Next MD Visit 03/12/21                         [facility] Adult PT Treatment/Exercise - 02/25/21 0001      Knee/Hip Exercises: Stretches   Gastroc Stretch Both;2 reps;60 seconds    Gastroc Stretch Limitations slantboard      Knee/Hip Exercises: Standing   Heel Raises Both;3 sets;10 reps    Knee Flexion Strengthening;Both;3 sets;10  reps    Knee Flexion Limitations 3 lbs    Hip ADduction Strengthening;Right;2 sets;10 reps    Hip ADduction Limitations 3 lbs    Lateral Step Up Other (comment)   lateral stepping over 6" hurdles with 3 lbs ankle weights x 2 min   SLS SLS progression with LLE elevated on 6" step and performing lift-off 2x10    Gait Training tandem stance 2x15 sec right/left    Other Standing Knee Exercises sidestepping x 2 min    Other Standing Knee Exercises static standing 3x10 sec eyes open/closed on airex pad      Knee/Hip Exercises: Seated   Long Arc Quad Strengthening;Both;3 sets;10 reps    Long Arc Quad Weight 3 lbs.    Ball Squeeze 3x10    Sit to [employer] 2 sets;10 reps;without UE support   holding 5 lbs                 PT Education - 02/25/21 0839    Education Details education in HEP progression and performing single leg stance at sink for RLE    Person(s) Educated Patient    Methods Explanation    Comprehension Verbalized understanding             PT Short Term Goals - 02/07/21 0848      PT SHORT TERM GOAL #1   Title Patient will be independent with HEP in order to improve functional outcomes.    Time 3    Period Weeks    Status On-going    Target Date 02/26/21      PT SHORT TERM GOAL #2   Title Patient will report at least 25% improvement in symptoms for improved quality of life.    Time 3    Period Weeks    Status On-going    Target Date 02/26/21             PT Long Term Goals - 02/07/21 0849      PT LONG TERM GOAL #1   Title Patient will report at least 75% improvement in symptoms for improved quality of life.    Time 6    Period Weeks    Status On-going      PT LONG TERM GOAL #2   Title Patient will improve FOTO score by at least 25 points in order to indicate improved tolerance to activity.    Time 6    Period Weeks    Status On-going      PT LONG TERM GOAL #3   Title Patient will be able to ambulate at least 400 feet in 2MWT in order to demonstrate improved gait speed for community ambulation.    Time 6    Period Weeks    Status On-going      PT LONG TERM GOAL #4   Title Patient will be able to complete 5x STS in under 11.4 seconds without compensation in order to demonstrate improved LE strength.    Time 6    Period Weeks    Status On-going      PT LONG TERM GOAL #5   Title Patient will be able to navigate stairs with reciprocal pattern without compensation in order to demonstrate improved LE strength.    Time 6    Period Weeks    Status On-going                 Plan - 02/25/21 8841    Clinical Impression Statement Pt progressing very well with  POC details. Continues to demo right hip abduction/extension weakness as evidenced with maintaining RLE single limb support and some hip drop in stance phase. Pt instructed in techniuqe to perfrom at home to improve RLE single limb support to progress stance control during ambulation    Personal Factors and Comorbidities Comorbidity  1;Behavior Pattern;Time since onset of injury/illness/exacerbation    Comorbidities HTN    Examination-Activity Limitations Locomotion Level;Transfers;Stand;Stairs;Squat;Dressing;Caring for Others;Sleep    Examination-Participation Restrictions Cleaning;Community Activity;Shop;Volunteer;Yard Work;Laundry;Meal Prep;Driving    Stability/Clinical Decision Making Stable/Uncomplicated    Rehab Potential Good    PT Frequency 2x / week    PT Duration 6 weeks    PT Treatment/Interventions ADLs/Self Care Home Management;Aquatic Therapy;Electrical Stimulation;Cryotherapy;Iontophoresis 4mg /ml Dexamethasone;Moist Heat;Traction;Ultrasound;DME Instruction;Gait training;Stair training;Functional mobility training;Therapeutic activities;Therapeutic exercise;Balance training;Neuromuscular re-education;Patient/family education;Orthotic Fit/Training;Manual techniques;Manual lymph drainage;Compression bandaging;Dry needling;Energy conservation;Splinting;Taping;Vasopneumatic Device;Spinal Manipulations;Joint Manipulations    PT Next Visit Plan continue  LE strengthening and hip mobility    PT Home Exercise Plan 4/6 ankle pumps, LAQ, quad sets, supine heel slides, glute set 4/21 Hip abd and ext    Consulted and Agree with Plan of Care Patient           Patient will benefit from skilled therapeutic intervention in order to improve the following deficits and impairments:  Abnormal gait,Difficulty walking,Decreased range of motion,Decreased endurance,Increased muscle spasms,Decreased activity tolerance,Pain,Decreased balance,Improper body mechanics,Decreased mobility,Decreased strength,Increased edema  Visit Diagnosis: Pain in right hip  Muscle weakness (generalized)  Other abnormalities of gait and mobility     Problem List Patient Active Problem List   Diagnosis Date Noted  . Encounter for well woman exam with routine gynecological exam 10/08/2020  . Encounter for screening fecal occult blood testing  10/08/2020  . Screening for colorectal cancer 10/03/2019  . Encounter for gynecological examination with Papanicolaou smear of cervix 10/03/2019  . Well woman exam with routine gynecological exam 01/10/2018  . Anxiety 01/04/2017  . GERD (gastroesophageal reflux disease) 10/14/2015  . Chest pain, unspecified 07/31/2014  . Stress at home 12/11/2013   8:56 AM, 02/25/21 M. Sherlyn Lees, PT, DPT Physical Therapist- [place] Office Number: [phone]  [place] [address] [place], [place], [address] Phone: [phone]   Fax:  [phone]  Name: Barbara Phillips MRN: [id number] Date of Birth: 01/24/1956

## 2021-02-27 ENCOUNTER — Ambulatory Visit (HOSPITAL_COMMUNITY): Payer: 59 | Admitting: Physical Therapy

## 2021-02-27 ENCOUNTER — Encounter (HOSPITAL_COMMUNITY): Payer: Self-pay | Admitting: Physical Therapy

## 2021-02-27 ENCOUNTER — Other Ambulatory Visit: Payer: Self-pay

## 2021-02-27 DIAGNOSIS — M6281 Muscle weakness (generalized): Secondary | ICD-10-CM

## 2021-02-27 DIAGNOSIS — M25551 Pain in right hip: Secondary | ICD-10-CM | POA: Diagnosis not present

## 2021-02-27 DIAGNOSIS — R2689 Other abnormalities of gait and mobility: Secondary | ICD-10-CM

## 2021-02-27 NOTE — Therapy (Signed)
[facility] [address] [place], [place], [address] Phone: [phone]   Fax:  [phone]  Physical Therapy Treatment  Patient Details  Name: Barbara Phillips MRN: [id number] Date of Birth: [birth year] Referring Provider (PT): [name] MD   Encounter Date: 02/27/2021   PT End of Session - 02/27/21 0827    Visit Number 8    Number of Visits 12    Date for PT Re-Evaluation 03/19/21    Authorization Type [employer] ( 30 vl PT/OT/chiro)    Authorization - Visit Number 8    Authorization - Number of Visits 30    Progress Note Due on Visit 10    PT Start Time 0828    PT Stop Time 0906    PT Time Calculation (min) 38 min    Activity Tolerance Patient tolerated treatment well    Behavior During Therapy [facility] for tasks assessed/performed           Past Medical History:  Diagnosis Date  . Arthritis   . GERD (gastroesophageal reflux disease)   . Hypercholesteremia   . Hypertension   . Stress at home 12/11/2013   Has to oversee care of 62 year old mom who lives an hour away  . Trauma 07/29/15    skull fx, hematoma and concussion was hit at work by 600 lb buggy, has eye probelms now    Past Surgical History:  Procedure Laterality Date  . CESAREAN SECTION    . COLONOSCOPY  09/08/2012   Procedure: COLONOSCOPY;  Surgeon: Rogene Houston, MD;  [facility];  Service: Endoscopy;  Laterality: N/A;  830  . correction of lazy eye surgery     . ESOPHAGOGASTRODUODENOSCOPY N/A 08/10/2014   Procedure: ESOPHAGOGASTRODUODENOSCOPY (EGD);  Surgeon: Rogene Houston, MD;  [facility]: [facility];  Service: Endoscopy;  Laterality: N/A;  1015 - moved to 12:10 - Ann to notify pt  . EYE SURGERY     lasik surgery   . 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   Subjective Assessment - 02/27/21 0828    Subjective Patient states no issues with her hip. She had a pin stick type pain this morning while driving. She is having some tenderness. She is back to doing a lot of things. Energy level is not quite where it was.    Limitations Standing;House hold activities;Walking    Patient Stated Goals Get her life back    Currently in Pain? Yes    Pain Score 4     Pain Location Hip    Pain Orientation Right    Pain Descriptors / Indicators Tender    Pain Type Surgical pain    Pain Onset Yesterday                             [facility] Adult PT Treatment/Exercise - 02/27/21 0001      Knee/Hip Exercises: Standing   Heel Raises Both;2 sets;15 reps    Heel Raises Limitations on incline slope    Lateral Step Up Right;2 sets;10 reps;Hand Hold: 2;Step Height: 6"    Lateral Step Up Limitations eccentric control    Forward Step Up Right;15 reps;Hand Hold: 1;Step Height: 6"    Stairs 4 RT 4  inch step    SLS with Vectors 3x 5 second holds    Other Standing Knee Exercises hip slide out 2x 10      Knee/Hip Exercises: Seated   Sit to Sand 2 sets;10 reps;without UE support   with 5lb weight                 PT Education - 02/27/21 0828    Education Details HEP, exercise mechanics, completing stairs with alternating pattern    Person(s) Educated Patient    Methods Explanation;Demonstration;Handout    Comprehension Verbalized understanding;Returned demonstration            PT Short Term Goals - 02/07/21 0848      PT SHORT TERM GOAL #1   Title Patient will be independent with HEP in order to improve functional outcomes.    Time 3    Period Weeks    Status On-going    Target Date 02/26/21      PT SHORT TERM GOAL #2   Title Patient will report at least 25% improvement in symptoms for improved quality of life.    Time 3    Period Weeks    Status On-going    Target Date 02/26/21             PT Long Term Goals -  02/07/21 0849      PT LONG TERM GOAL #1   Title Patient will report at least 75% improvement in symptoms for improved quality of life.    Time 6    Period Weeks    Status On-going      PT LONG TERM GOAL #2   Title Patient will improve FOTO score by at least 25 points in order to indicate improved tolerance to activity.    Time 6    Period Weeks    Status On-going      PT LONG TERM GOAL #3   Title Patient will be able to ambulate at least 400 feet in 2MWT in order to demonstrate improved gait speed for community ambulation.    Time 6    Period Weeks    Status On-going      PT LONG TERM GOAL #4   Title Patient will be able to complete 5x STS in under 11.4 seconds without compensation in order to demonstrate improved LE strength.    Time 6    Period Weeks    Status On-going      PT LONG TERM GOAL #5   Title Patient will be able to navigate stairs with reciprocal pattern without compensation in order to demonstrate improved LE strength.    Time 6    Period Weeks    Status On-going                 Plan - 02/27/21 1610    Clinical Impression Statement Patient able to progress to increased step height with step ups with good mechanics and is given cueing for reducing UE support. Patient showing improving eccentric control with lateral step down but requires bilateral UE support for balance. Patient given cueing for alternating pattern with stairs on 4 inch step with good mechanics but decreased eccentric control and confidence. Patient requires verbal and tactile cueing for hip slide out exercise with fair mechanics following. Patient with c/o anterior hip symptoms with excessive ROM with SLS with vectors which improves with cueing for reducing ROM and glute activation. Patient will continue to benefit from skilled physical therapy in order to  reduce impairment and improve function.    Personal Factors and Comorbidities Comorbidity 1;Behavior Pattern;Time since onset of  injury/illness/exacerbation    Comorbidities HTN    Examination-Activity Limitations Locomotion Level;Transfers;Stand;Stairs;Squat;Dressing;Caring for Others;Sleep    Examination-Participation Restrictions Cleaning;Community Activity;Shop;Volunteer;Yard Work;Laundry;Meal Prep;Driving    Stability/Clinical Decision Making Stable/Uncomplicated    Rehab Potential Good    PT Frequency 2x / week    PT Duration 6 weeks    PT Treatment/Interventions ADLs/Self Care Home Management;Aquatic Therapy;Electrical Stimulation;Cryotherapy;Iontophoresis 4mg /ml Dexamethasone;Moist Heat;Traction;Ultrasound;DME Instruction;Gait training;Stair training;Functional mobility training;Therapeutic activities;Therapeutic exercise;Balance training;Neuromuscular re-education;Patient/family education;Orthotic Fit/Training;Manual techniques;Manual lymph drainage;Compression bandaging;Dry needling;Energy conservation;Splinting;Taping;Vasopneumatic Device;Spinal Manipulations;Joint Manipulations    PT Next Visit Plan continue  LE strengthening and hip mobility    PT Home Exercise Plan 4/6 ankle pumps, LAQ, quad sets, supine heel slides, glute set 4/21 Hip abd and ext 4/28 SLS with vectors    Consulted and Agree with Plan of Care Patient           Patient will benefit from skilled therapeutic intervention in order to improve the following deficits and impairments:  Abnormal gait,Difficulty walking,Decreased range of motion,Decreased endurance,Increased muscle spasms,Decreased activity tolerance,Pain,Decreased balance,Improper body mechanics,Decreased mobility,Decreased strength,Increased edema  Visit Diagnosis: Pain in right hip  Muscle weakness (generalized)  Other abnormalities of gait and mobility     Problem List Patient Active Problem List   Diagnosis Date Noted  . Encounter for well woman exam with routine gynecological exam 10/08/2020  . Encounter for screening fecal occult blood testing 10/08/2020  .  Screening for colorectal cancer 10/03/2019  . Encounter for gynecological examination with Papanicolaou smear of cervix 10/03/2019  . Well woman exam with routine gynecological exam 01/10/2018  . Anxiety 01/04/2017  . GERD (gastroesophageal reflux disease) 10/14/2015  . Chest pain, unspecified 07/31/2014  . Stress at home 12/11/2013    9:12 AM, 02/27/21 Mearl Latin PT, DPT Physical Therapist at [facility] [place], [place], [address] Phone: [phone]   Fax:  [phone]  Name: Barbara Phillips MRN: [id number] Date of Birth: [birth year]

## 2021-02-27 NOTE — Patient Instructions (Signed)
Access Code: [email or web address] E URL: [email or web address] Date: 02/27/2021 Prepared by: Mitzi Hansen Jumar Greenstreet  Exercises Single Leg Balance with Clock Reach - 1 x daily - 7 x weekly - 5 reps - 5 second hold

## 2021-03-04 ENCOUNTER — Encounter (HOSPITAL_COMMUNITY): Payer: Self-pay | Admitting: Physical Therapy

## 2021-03-04 ENCOUNTER — Ambulatory Visit (HOSPITAL_COMMUNITY): Payer: 59 | Attending: Orthopedic Surgery | Admitting: Physical Therapy

## 2021-03-04 ENCOUNTER — Other Ambulatory Visit: Payer: Self-pay

## 2021-03-04 DIAGNOSIS — M25551 Pain in right hip: Secondary | ICD-10-CM | POA: Insufficient documentation

## 2021-03-04 DIAGNOSIS — M6281 Muscle weakness (generalized): Secondary | ICD-10-CM | POA: Diagnosis present

## 2021-03-04 DIAGNOSIS — R2689 Other abnormalities of gait and mobility: Secondary | ICD-10-CM | POA: Insufficient documentation

## 2021-03-04 NOTE — Patient Instructions (Signed)
Access Code: [email or web address] URL: [email or web address] Date: 03/04/2021 Prepared by: Mitzi Hansen Girl Schissler  Exercises Lateral Step Down - 1 x daily - 7 x weekly - 3 sets - 10 reps

## 2021-03-04 NOTE — Therapy (Signed)
[facility] [place], [place], [address] Phone: [phone]   Fax:  [phone]  Physical Therapy Treatment  Patient Details  Name: Barbara Phillips MRN: [id number] Date of Birth: 1956/01/28 Referring Provider (PT): [name] MD   Encounter Date: 03/04/2021   PT End of Session - 03/04/21 0826    Visit Number 9    Number of Visits 12    Date for PT Re-Evaluation 03/19/21    Authorization Type [employer] ( 30 vl PT/OT/chiro)    Authorization - Visit Number 9    Authorization - Number of Visits 30    Progress Note Due on Visit 10    PT Start Time 0828    PT Stop Time 0908    PT Time Calculation (min) 40 min    Activity Tolerance Patient tolerated treatment well    Behavior During Therapy [facility] for tasks assessed/performed           Past Medical History:  Diagnosis Date  . Arthritis   . GERD (gastroesophageal reflux disease)   . Hypercholesteremia   . Hypertension   . Stress at home 12/11/2013   Has to oversee care of 77 year old mom who lives an hour away  . Trauma 07/29/15    skull fx, hematoma and concussion was hit at work by 600 lb buggy, has eye probelms now    Past Surgical History:  Procedure Laterality Date  . CESAREAN SECTION    . COLONOSCOPY  09/08/2012   Procedure: COLONOSCOPY;  Surgeon: Rogene Houston, MD;  [facility];  Service: Endoscopy;  Laterality: N/A;  830  . correction of lazy eye surgery     . ESOPHAGOGASTRODUODENOSCOPY N/A 08/10/2014   Procedure: ESOPHAGOGASTRODUODENOSCOPY (EGD);  Surgeon: Rogene Houston, MD;  [facility];  Service: Endoscopy;  Laterality: N/A;  1015 - moved to 12:10 - Ann to notify pt  . EYE SURGERY     lasik surgery   . 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   Subjective Assessment - 03/04/21 0827    Subjective Her back is bothering her today because she overdid the cleaning yesterday. Hip is doing good. Her hip was sore after last session. Numbness/ tenderness is starting to subside.  Stairs are still tough.    Limitations Standing;House hold activities;Walking    Patient Stated Goals Get her life back    Currently in Pain? No/denies    Pain Onset [facility] Adult PT Treatment/Exercise - 03/04/21 0001      Knee/Hip Exercises: Standing   Heel Raises Both;1 set;20 reps    Heel Raises Limitations on incline slope    Lateral Step Up Right;2 sets;10 reps;Hand Hold: 2;Step Height: 6"    Lateral Step Up Limitations eccentric control    Forward Step Up Both;2 sets;10 reps;Hand Hold: 2;Step Height: 6"    Forward Step Up Limitations contralateral knee drive    Step Down Right;3 sets;10 reps;Step Height: 4";Hand Hold: 2   1st step 4 inch step, 2nd and 3rd step airex   Functional Squat 2 sets;10 reps    Functional Squat  Limitations 18 inch box, 1st set body weight, 2nd set 5#    Stairs 2RT 4 inch step    Gait Training 200 feet with and without SPC                  PT Education - 03/04/21 0826    Education Details HEP, exercise mechanics, hip strength    Person(s) Educated Patient    Methods Explanation;Demonstration;Handout    Comprehension Verbalized understanding;Returned demonstration            PT Short Term Goals - 02/07/21 0848      PT SHORT TERM GOAL #1   Title Patient will be independent with HEP in order to improve functional outcomes.    Time 3    Period Weeks    Status On-going    Target Date 02/26/21      PT SHORT TERM GOAL #2   Title Patient will report at least 25% improvement in symptoms for improved quality of life.    Time 3    Period Weeks    Status On-going    Target Date 02/26/21             PT Long Term Goals - 02/07/21 0849      PT  LONG TERM GOAL #1   Title Patient will report at least 75% improvement in symptoms for improved quality of life.    Time 6    Period Weeks    Status On-going      PT LONG TERM GOAL #2   Title Patient will improve FOTO score by at least 25 points in order to indicate improved tolerance to activity.    Time 6    Period Weeks    Status On-going      PT LONG TERM GOAL #3   Title Patient will be able to ambulate at least 400 feet in 2MWT in order to demonstrate improved gait speed for community ambulation.    Time 6    Period Weeks    Status On-going      PT LONG TERM GOAL #4   Title Patient will be able to complete 5x STS in under 11.4 seconds without compensation in order to demonstrate improved LE strength.    Time 6    Period Weeks    Status On-going      PT LONG TERM GOAL #5   Title Patient will be able to navigate stairs with reciprocal pattern without compensation in order to demonstrate improved LE strength.    Time 6    Period Weeks    Status On-going                 Plan - 03/04/21 1610    Clinical Impression Statement Patient continues to have minimal tenderness in anterior hip. Patient able to complete step up with knee drive for increasing glute activation and balance. She requires bilateral UE support with step up secondary to impaired dynamic balance. She is given cueing for eccentric control with lateral step down and is showing improving eccentric strength. Patient continues to ambulate with minimally antalgic gait with and without SPC. She has difficulty with reciprocal pattern on stairs secondary to impaired eccentric strength. Patient with heavy upper extremity with forward step down on 4 inch step, transitioned to forward step down on airex. Patient requires frequent verbal and tactile cueing for mechanics. Patient showing improving stair mechanics on 4 inch step with alternating pattern likely due to improving motor unit recruitment with  fatigue. Patient will  continue to benefit from skilled physical therapy in order to reduce impairment and improve function.    Personal Factors and Comorbidities Comorbidity 1;Behavior Pattern;Time since onset of injury/illness/exacerbation    Comorbidities HTN    Examination-Activity Limitations Locomotion Level;Transfers;Stand;Stairs;Squat;Dressing;Caring for Others;Sleep    Examination-Participation Restrictions Cleaning;Community Activity;Shop;Volunteer;Yard Work;Laundry;Meal Prep;Driving    Stability/Clinical Decision Making Stable/Uncomplicated    Rehab Potential Good    PT Frequency 2x / week    PT Duration 6 weeks    PT Treatment/Interventions ADLs/Self Care Home Management;Aquatic Therapy;Electrical Stimulation;Cryotherapy;Iontophoresis 4mg /ml Dexamethasone;Moist Heat;Traction;Ultrasound;DME Instruction;Gait training;Stair training;Functional mobility training;Therapeutic activities;Therapeutic exercise;Balance training;Neuromuscular re-education;Patient/family education;Orthotic Fit/Training;Manual techniques;Manual lymph drainage;Compression bandaging;Dry needling;Energy conservation;Splinting;Taping;Vasopneumatic Device;Spinal Manipulations;Joint Manipulations    PT Next Visit Plan continue  LE strengthening and hip mobility    PT Home Exercise Plan 4/6 ankle pumps, LAQ, quad sets, supine heel slides, glute set 4/21 Hip abd and ext 4/28 SLS with vectors 5/3 lateral step down    Consulted and Agree with Plan of Care Patient           Patient will benefit from skilled therapeutic intervention in order to improve the following deficits and impairments:  Abnormal gait,Difficulty walking,Decreased range of motion,Decreased endurance,Increased muscle spasms,Decreased activity tolerance,Pain,Decreased balance,Improper body mechanics,Decreased mobility,Decreased strength,Increased edema  Visit Diagnosis: Pain in right hip  Muscle weakness (generalized)  Other abnormalities of gait and  mobility     Problem List Patient Active Problem List   Diagnosis Date Noted  . Encounter for well woman exam with routine gynecological exam 10/08/2020  . Encounter for screening fecal occult blood testing 10/08/2020  . Screening for colorectal cancer 10/03/2019  . Encounter for gynecological examination with Papanicolaou smear of cervix 10/03/2019  . Well woman exam with routine gynecological exam 01/10/2018  . Anxiety 01/04/2017  . GERD (gastroesophageal reflux disease) 10/14/2015  . Chest pain, unspecified 07/31/2014  . Stress at home 12/11/2013    9:10 AM, 03/04/21 Mearl Latin PT, DPT Physical Therapist at [place], [place], [address] Phone: [phone]   Fax:  [phone]  Name: Griselle Rufer MRN: [id number] Date of Birth: 11-28-1955

## 2021-03-06 ENCOUNTER — Encounter (HOSPITAL_COMMUNITY): Payer: Self-pay | Admitting: Physical Therapy

## 2021-03-06 ENCOUNTER — Other Ambulatory Visit: Payer: Self-pay

## 2021-03-06 ENCOUNTER — Ambulatory Visit (HOSPITAL_COMMUNITY): Payer: 59 | Admitting: Physical Therapy

## 2021-03-06 DIAGNOSIS — M25551 Pain in right hip: Secondary | ICD-10-CM | POA: Diagnosis not present

## 2021-03-06 DIAGNOSIS — R2689 Other abnormalities of gait and mobility: Secondary | ICD-10-CM

## 2021-03-06 DIAGNOSIS — M6281 Muscle weakness (generalized): Secondary | ICD-10-CM

## 2021-03-06 NOTE — Therapy (Signed)
[place], [place], [address] Phone: [phone]   Fax:  [phone]  Physical Therapy Treatment/Progress note  Patient Details  Name: Barbara Phillips MRN: [id number] Date of Birth: 1956-01-12 Referring Provider (PT): [name] MD   Encounter Date: 03/06/2021  Progress Note   Reporting Period 02/05/21 to 03/06/21   See note below for Objective Data and Assessment of Progress/Goals    PT End of Session - 03/06/21 0826    Visit Number 10    Number of Visits 12    Date for PT Re-Evaluation 03/19/21    Authorization Type [employer] ( 30 vl PT/OT/chiro)    Authorization - Visit Number 10    Authorization - Number of Visits 30    Progress Note Due on Visit 20    PT Start Time 0827    PT Stop Time 0907    PT Time Calculation (min) 40 min    Activity Tolerance Patient tolerated treatment well    Behavior During Therapy [facility] for tasks assessed/performed           Past Medical History:  Diagnosis Date  . Arthritis   . GERD (gastroesophageal reflux disease)   . Hypercholesteremia   . Hypertension   . Stress at home 12/11/2013   Has to oversee care of 60 year old mom who lives an hour away  . Trauma 07/29/15    skull fx, hematoma and concussion was hit at work by 600 lb buggy, has eye probelms now    Past Surgical History:  Procedure Laterality Date  . CESAREAN SECTION    . COLONOSCOPY  09/08/2012   Procedure: COLONOSCOPY;  Surgeon: Rogene Houston, MD;  [facility];  Service: Endoscopy;  Laterality: N/A;  830  . correction of lazy eye surgery     . ESOPHAGOGASTRODUODENOSCOPY N/A 08/10/2014   Procedure: ESOPHAGOGASTRODUODENOSCOPY (EGD);  Surgeon: Rogene Houston, MD;  [facility];  Service: Endoscopy;  Laterality: N/A;  1015 - moved to 12:10 - Ann to notify pt  . EYE SURGERY     lasik surgery   . TOTAL HIP ARTHROPLASTY Right 02/04/2021   Procedure: TOTAL HIP ARTHROPLASTY ANTERIOR  APPROACH;  Surgeon: Renette Butters, MD;  [facility]: [facility];  Service: Orthopedics;  Laterality: Right;    There were no vitals filed for this visit.   Subjective Assessment - 03/06/21 0827    Subjective Patient states her home exercises are going well but she had trouble with the sideways step down one due to pain in her knee. Patient states 80% improvement with PT intervention with continued limp and trouble with stairs. She continues to have tenderness and strength deficits.    Limitations Standing;House hold activities;Walking    Patient Stated Goals Get her life back    Currently in Pain? No/denies              [facility] PT Assessment - 03/06/21 0001      Assessment   Medical Diagnosis R anterior THA    Referring Provider (PT) [name] MD    Onset Date/Surgical Date 02/04/21    Next MD Visit 03/12/21    Prior Therapy none      Precautions   Precautions Anterior Hip      Restrictions   Weight Bearing Restrictions No      Balance Screen   Has the patient fallen in the past 6 months No    Has the patient had  a decrease in activity level because of a fear of falling?  No    Is the patient reluctant to leave their home because of a fear of falling?  No      Prior Function   Level of Independence Independent    Vocation Retired      Charity fundraiser Status Within Functional Limits for tasks assessed      Observation/Other Assessments   Observations Ambulates without AD, minimally antalgic    Focus on Therapeutic Outcomes (FOTO)  72% function      Strength   Right Hip Flexion 5/5    Right Knee Flexion 5/5    Right Knee Extension 5/5      Transfers   Five time sit to stand comments  6.83 seconds without UE use      Ambulation/Gait   Ambulation/Gait Yes    Ambulation/Gait Assistance 6: Modified independent (Device/Increase time)    Ambulation Distance (Feet) 420 Feet    Assistive device None    Gait Pattern Step-through pattern;Antalgic     Ambulation Surface Level;Indoor    Gait Comments 2MWT; difficulty with step up and step down on 7 inch stair due to R knee pain and decreased strength                         [facility] Adult PT Treatment/Exercise - 03/06/21 0001      Knee/Hip Exercises: Standing   Lateral Step Up Right;2 sets;10 reps;Hand Hold: 2;Step Height: 4"    Lateral Step Up Limitations eccentric control    Forward Step Up Both;2 sets;10 reps;Hand Hold: 2;Step Height: 6"    Forward Step Up Limitations contralateral knee drive    Step Down Right;2 sets;10 reps;Hand Hold: 2    Step Down Limitations on airex    Stairs 4RT 4 inch step                  PT Education - 03/06/21 0831    Education Details HEP, exercise mechanics, progress made    Person(s) Educated Patient    Methods Explanation;Demonstration    Comprehension Verbalized understanding;Returned demonstration            PT Short Term Goals - 03/06/21 0831      PT SHORT TERM GOAL #1   Title Patient will be independent with HEP in order to improve functional outcomes.    Time 3    Period Weeks    Status Achieved    Target Date 02/26/21      PT SHORT TERM GOAL #2   Title Patient will report at least 25% improvement in symptoms for improved quality of life.    Time 3    Period Weeks    Status Achieved    Target Date 02/26/21             PT Long Term Goals - 03/06/21 0831      PT LONG TERM GOAL #1   Title Patient will report at least 75% improvement in symptoms for improved quality of life.    Time 6    Period Weeks    Status Achieved      PT LONG TERM GOAL #2   Title Patient will improve FOTO score by at least 25 points in order to indicate improved tolerance to activity.    Time 6    Period Weeks    Status Achieved      PT LONG TERM GOAL #3  Title Patient will be able to ambulate at least 400 feet in 2MWT in order to demonstrate improved gait speed for community ambulation.    Time 6    Period Weeks     Status Achieved      PT LONG TERM GOAL #4   Title Patient will be able to complete 5x STS in under 11.4 seconds without compensation in order to demonstrate improved LE strength.    Time 6    Period Weeks    Status Achieved      PT LONG TERM GOAL #5   Title Patient will be able to navigate stairs with reciprocal pattern without compensation in order to demonstrate improved LE strength.    Time 6    Period Weeks    Status On-going                 Plan - 03/06/21 3212    Clinical Impression Statement Patient has met 2/2 short term goals and 4/5 long term goals with ability to complete HEP and improvement in symptoms, activity tolerance, gait, strength, and functional mobility. Patient continues to remain limited by symptoms, gait, and strength. Patient overall progressing well but continues to have difficulty with stairs secondary to impaired eccentric hip and quad strength. She also shows impaired hip abduction strength during stance phase of gait. Patient completes lateral step down on 6 inch step with good mechanics but c/o knee pain secondary to impaired strength, able to complete from about 4 inch step with good mechanics and quad fatigue. Will likely d/c patient at end of POC. Patient will continue to benefit from skilled physical therapy in order to reduce impairment and improve function.    Personal Factors and Comorbidities Comorbidity 1;Behavior Pattern;Time since onset of injury/illness/exacerbation    Comorbidities HTN    Examination-Activity Limitations Locomotion Level;Transfers;Stand;Stairs;Squat;Dressing;Caring for Others;Sleep    Examination-Participation Restrictions Cleaning;Community Activity;Shop;Volunteer;Yard Work;Laundry;Meal Prep;Driving    Stability/Clinical Decision Making Stable/Uncomplicated    Rehab Potential Good    PT Frequency 2x / week    PT Duration 6 weeks    PT Treatment/Interventions ADLs/Self Care Home Management;Aquatic Therapy;Electrical  Stimulation;Cryotherapy;Iontophoresis 37m/ml Dexamethasone;Moist Heat;Traction;Ultrasound;DME Instruction;Gait training;Stair training;Functional mobility training;Therapeutic activities;Therapeutic exercise;Balance training;Neuromuscular re-education;Patient/family education;Orthotic Fit/Training;Manual techniques;Manual lymph drainage;Compression bandaging;Dry needling;Energy conservation;Splinting;Taping;Vasopneumatic Device;Spinal Manipulations;Joint Manipulations    PT Next Visit Plan continue  LE strengthening and hip mobility    PT Home Exercise Plan 4/6 ankle pumps, LAQ, quad sets, supine heel slides, glute set 4/21 Hip abd and ext 4/28 SLS with vectors 5/3 lateral step down    Consulted and Agree with Plan of Care Patient           Patient will benefit from skilled therapeutic intervention in order to improve the following deficits and impairments:  Abnormal gait,Difficulty walking,Decreased range of motion,Decreased endurance,Increased muscle spasms,Decreased activity tolerance,Pain,Decreased balance,Improper body mechanics,Decreased mobility,Decreased strength,Increased edema  Visit Diagnosis: Pain in right hip  Muscle weakness (generalized)  Other abnormalities of gait and mobility     Problem List Patient Active Problem List   Diagnosis Date Noted  . Encounter for well woman exam with routine gynecological exam 10/08/2020  . Encounter for screening fecal occult blood testing 10/08/2020  . Screening for colorectal cancer 10/03/2019  . Encounter for gynecological examination with Papanicolaou smear of cervix 10/03/2019  . Well woman exam with routine gynecological exam 01/10/2018  . Anxiety 01/04/2017  . GERD (gastroesophageal reflux disease) 10/14/2015  . Chest pain, unspecified 07/31/2014  . Stress at home 12/11/2013    9:05  AM, 03/06/21 Mearl Latin PT, DPT Physical Therapist at [place], [place], [address] Phone: [phone]   Fax:  [phone]  Name: Barbara Phillips MRN: [id number] Date of Birth: 1956-04-08

## 2021-03-11 ENCOUNTER — Other Ambulatory Visit: Payer: Self-pay

## 2021-03-11 ENCOUNTER — Ambulatory Visit (HOSPITAL_COMMUNITY): Payer: 59 | Admitting: Physical Therapy

## 2021-03-11 ENCOUNTER — Encounter (HOSPITAL_COMMUNITY): Payer: Self-pay | Admitting: Physical Therapy

## 2021-03-11 DIAGNOSIS — M6281 Muscle weakness (generalized): Secondary | ICD-10-CM

## 2021-03-11 DIAGNOSIS — M25551 Pain in right hip: Secondary | ICD-10-CM

## 2021-03-11 DIAGNOSIS — R2689 Other abnormalities of gait and mobility: Secondary | ICD-10-CM

## 2021-03-11 NOTE — Therapy (Signed)
[place], [place], [address] Phone: [phone]   Fax:  [phone]  Physical Therapy Treatment  Patient Details  Name: Barbara Phillips MRN: [id number] Date of Birth: 1956-05-14 Referring Provider (PT): [name] MD   Encounter Date: 03/11/2021   PT End of Session - 03/11/21 0823    Visit Number 11    Number of Visits 12    Date for PT Re-Evaluation 03/19/21    Authorization Type [employer] ( 30 vl PT/OT/chiro)    Authorization - Visit Number 11    Authorization - Number of Visits 30    Progress Note Due on Visit 20    PT Start Time 0825    PT Stop Time 0905    PT Time Calculation (min) 40 min    Activity Tolerance Patient tolerated treatment well    Behavior During Therapy [facility] for tasks assessed/performed           Past Medical History:  Diagnosis Date  . Arthritis   . GERD (gastroesophageal reflux disease)   . Hypercholesteremia   . Hypertension   . Stress at home 12/11/2013   Has to oversee care of 47 year old mom who lives an hour away  . Trauma 07/29/15    skull fx, hematoma and concussion was hit at work by 600 lb buggy, has eye probelms now    Past Surgical History:  Procedure Laterality Date  . CESAREAN SECTION    . COLONOSCOPY  09/08/2012   Procedure: COLONOSCOPY;  Surgeon: Rogene Houston, MD;  [facility];  Service: Endoscopy;  Laterality: N/A;  830  . correction of lazy eye surgery     . ESOPHAGOGASTRODUODENOSCOPY N/A 08/10/2014   Procedure: ESOPHAGOGASTRODUODENOSCOPY (EGD);  Surgeon: Rogene Houston, MD;  [facility];  Service: Endoscopy;  Laterality: N/A;  1015 - moved to 12:10 - Ann to notify pt  . EYE SURGERY     lasik surgery   . 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   Subjective Assessment - 03/11/21 0824    Subjective Patient states she was having a sharp throbbing pain in her hip yesterday, ice helped. Her home exercises are going well. She made the step height smaller for the side stair one. She practiced on her steps with alternating pattern.    Limitations Standing;House hold activities;Walking    Patient Stated Goals Get her life back    Currently in Pain? No/denies                             [facility] Adult PT Treatment/Exercise - 03/11/21 0001      Knee/Hip Exercises: Standing   Forward Lunges Both;2 sets;10 reps    Forward Lunges Limitations with blue foam    Lateral Step Up Right;2 sets;10 reps;Hand Hold: 2;Step Height: 6"    Lateral Step Up Limitations eccentric control    Forward Step Up Both;2 sets;15 reps;Hand Hold: 1;Step Height: 6"    Forward Step Up Limitations contralateral knee drive    Step Down Right;2 sets;10 reps;Hand Hold: 2;Step Height: 4"    Stairs 4RT 4 inch step, 2 RT 7 inch steps    Other Standing Knee Exercises lateral stepping with green  band at ankles 6x 10 feet bilateral                  PT Education - 03/11/21 0824    Education Details HEP, exercise technique    Person(s) Educated Patient    Methods Explanation;Demonstration    Comprehension Verbalized understanding;Returned demonstration            PT Short Term Goals - 03/06/21 0831      PT SHORT TERM GOAL #1   Title Patient will be independent with HEP in order to improve functional outcomes.    Time 3    Period Weeks    Status Achieved    Target Date 02/26/21      PT SHORT TERM GOAL #2   Title Patient will report at least 25% improvement in symptoms for improved quality of life.    Time 3    Period Weeks    Status Achieved    Target Date 02/26/21             PT Long Term Goals - 03/06/21 0831      PT LONG TERM GOAL #1   Title Patient will report at least 75% improvement in symptoms for improved  quality of life.    Time 6    Period Weeks    Status Achieved      PT LONG TERM GOAL #2   Title Patient will improve FOTO score by at least 25 points in order to indicate improved tolerance to activity.    Time 6    Period Weeks    Status Achieved      PT LONG TERM GOAL #3   Title Patient will be able to ambulate at least 400 feet in 2MWT in order to demonstrate improved gait speed for community ambulation.    Time 6    Period Weeks    Status Achieved      PT LONG TERM GOAL #4   Title Patient will be able to complete 5x STS in under 11.4 seconds without compensation in order to demonstrate improved LE strength.    Time 6    Period Weeks    Status Achieved      PT LONG TERM GOAL #5   Title Patient will be able to navigate stairs with reciprocal pattern without compensation in order to demonstrate improved LE strength.    Time 6    Period Weeks    Status On-going                 Plan - 03/11/21 3500    Clinical Impression Statement Patient given cueing throughout session for reduced UE support with exercises. Patient with continued eccentric strength deficit that is improving and she will likely be d/c at end of POC. Patient showing improved eccentric control with lateral step down but continues to require bilateral UE support for balance and strength deficits. Patient with minimal unsteadiness with eccentric phase of stairs but much improved since prior sessions. Patient eager to begin gardening, educated and performed lunging technique to and from floor to assist with transfers for gardening. Patient will continue to benefit from skilled physical therapy in order to reduce impairment and improve function.    Personal Factors and Comorbidities Comorbidity 1;Behavior Pattern;Time since onset of injury/illness/exacerbation    Comorbidities HTN    Examination-Activity Limitations Locomotion Level;Transfers;Stand;Stairs;Squat;Dressing;Caring for Others;Sleep     Examination-Participation Restrictions Cleaning;Community Activity;Shop;Volunteer;Yard Work;Laundry;Meal Prep;Driving    Stability/Clinical Decision Making Stable/Uncomplicated    Rehab Potential  Good    PT Frequency 2x / week    PT Duration 6 weeks    PT Treatment/Interventions ADLs/Self Care Home Management;Aquatic Therapy;Electrical Stimulation;Cryotherapy;Iontophoresis 4mg /ml Dexamethasone;Moist Heat;Traction;Ultrasound;DME Instruction;Gait training;Stair training;Functional mobility training;Therapeutic activities;Therapeutic exercise;Balance training;Neuromuscular re-education;Patient/family education;Orthotic Fit/Training;Manual techniques;Manual lymph drainage;Compression bandaging;Dry needling;Energy conservation;Splinting;Taping;Vasopneumatic Device;Spinal Manipulations;Joint Manipulations    PT Next Visit Plan continue  LE strengthening and hip mobility    PT Home Exercise Plan 4/6 ankle pumps, LAQ, quad sets, supine heel slides, glute set 4/21 Hip abd and ext 4/28 SLS with vectors 5/3 lateral step down 5/10 step up with knee drive, lunge, lateral stepping    Consulted and Agree with Plan of Care Patient           Patient will benefit from skilled therapeutic intervention in order to improve the following deficits and impairments:  Abnormal gait,Difficulty walking,Decreased range of motion,Decreased endurance,Increased muscle spasms,Decreased activity tolerance,Pain,Decreased balance,Improper body mechanics,Decreased mobility,Decreased strength,Increased edema  Visit Diagnosis: Pain in right hip  Muscle weakness (generalized)  Other abnormalities of gait and mobility     Problem List Patient Active Problem List   Diagnosis Date Noted  . Encounter for well woman exam with routine gynecological exam 10/08/2020  . Encounter for screening fecal occult blood testing 10/08/2020  . Screening for colorectal cancer 10/03/2019  . Encounter for gynecological examination with  Papanicolaou smear of cervix 10/03/2019  . Well woman exam with routine gynecological exam 01/10/2018  . Anxiety 01/04/2017  . GERD (gastroesophageal reflux disease) 10/14/2015  . Chest pain, unspecified 07/31/2014  . Stress at home 12/11/2013   9:06 AM, 03/11/21 Mearl Latin PT, DPT Physical Therapist at [place], [place], [address] Phone: [phone]   Fax:  [phone]  Name: Barbara Phillips MRN: [id number] Date of Birth: June 05, 1956

## 2021-03-11 NOTE — Patient Instructions (Signed)
Access Code: [email or web address] URL: [email or web address] Date: 03/11/2021 Prepared by: Mitzi Hansen Emaad Nanna  Exercises Runner's Step Up/Down - 1 x daily - 7 x weekly - 2 sets - 15 reps Lunge with Counter Support - 1 x daily - 7 x weekly - 2 sets - 10 reps Side Stepping with Resistance at Ankles - 1 x daily - 7 x weekly - 6 sets - 10 reps

## 2021-03-13 ENCOUNTER — Encounter (HOSPITAL_COMMUNITY): Payer: Self-pay | Admitting: Physical Therapy

## 2021-03-13 ENCOUNTER — Other Ambulatory Visit: Payer: Self-pay

## 2021-03-13 ENCOUNTER — Ambulatory Visit (HOSPITAL_COMMUNITY): Payer: 59 | Admitting: Physical Therapy

## 2021-03-13 DIAGNOSIS — M6281 Muscle weakness (generalized): Secondary | ICD-10-CM

## 2021-03-13 DIAGNOSIS — M25551 Pain in right hip: Secondary | ICD-10-CM

## 2021-03-13 DIAGNOSIS — R2689 Other abnormalities of gait and mobility: Secondary | ICD-10-CM

## 2021-03-13 NOTE — Patient Instructions (Signed)
Access Code: [email or web address] URL: [email or web address] Date: 03/13/2021 Prepared by: Mitzi Hansen Cederic Mozley  Exercises Prone Quadriceps Stretch with Strap - 1 x daily - 7 x weekly - 3 reps - 20 second hold Standing Knee Flexion - 1 x daily - 7 x weekly - 2 sets - 10 reps Standing Marching - 1 x daily - 7 x weekly - 2 sets - 10 reps
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
## 2021-08-11 ENCOUNTER — Other Ambulatory Visit (HOSPITAL_COMMUNITY): Payer: Self-pay | Admitting: General Practice

## 2021-08-11 DIAGNOSIS — Z1231 Encounter for screening mammogram for malignant neoplasm of breast: Secondary | ICD-10-CM

## 2021-10-06 ENCOUNTER — Other Ambulatory Visit: Payer: Self-pay

## 2021-10-06 ENCOUNTER — Ambulatory Visit (INDEPENDENT_AMBULATORY_CARE_PROVIDER_SITE_OTHER): Payer: 59 | Admitting: Gastroenterology

## 2021-10-06 ENCOUNTER — Encounter (INDEPENDENT_AMBULATORY_CARE_PROVIDER_SITE_OTHER): Payer: Self-pay | Admitting: Gastroenterology

## 2021-10-06 DIAGNOSIS — K219 Gastro-esophageal reflux disease without esophagitis: Secondary | ICD-10-CM | POA: Diagnosis not present

## 2021-10-06 MED ORDER — PANTOPRAZOLE SODIUM 40 MG PO TBEC: 40.0000 mg | DELAYED_RELEASE_TABLET | Freq: Every day | ORAL | 3 refills | Status: DC

## 2021-10-06 MED ORDER — FAMOTIDINE 40 MG PO TABS
40.0000 mg | ORAL_TABLET | ORAL | 3 refills | Status: DC | PRN
Start: 2021-10-06 — End: 2022-09-10
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
## 2021-10-06 NOTE — Patient Instructions (Signed)
Continue pantoprazole 40 mg every day Can take Pepcid 40 mg as needed when presenting breakthrough episodes of GERD.

## 2021-10-09 ENCOUNTER — Encounter: Payer: Self-pay | Admitting: Adult Health

## 2021-10-09 ENCOUNTER — Other Ambulatory Visit: Payer: Self-pay

## 2021-10-09 ENCOUNTER — Ambulatory Visit (INDEPENDENT_AMBULATORY_CARE_PROVIDER_SITE_OTHER): Payer: 59 | Admitting: Adult Health

## 2021-10-09 VITALS — BP 129/76 | HR 96 | Ht 61.0 in | Wt 140.0 lb

## 2021-10-09 DIAGNOSIS — Z01419 Encounter for gynecological examination (general) (routine) without abnormal findings: Secondary | ICD-10-CM

## 2021-10-09 DIAGNOSIS — Z1211 Encounter for screening for malignant neoplasm of colon: Secondary | ICD-10-CM | POA: Diagnosis not present

## 2021-10-09 LAB — HEMOCCULT GUIAC POC 1CARD (OFFICE): Fecal Occult Blood, POC: NEGATIVE

## 2021-10-09 NOTE — Progress Notes (Signed)
Patient ID: Barbara Phillips, female   DOB: [birth year], 65 y.o.   MRN: [id number] History of Present Illness: Barbara Phillips is a 65 year old white female, married, PM in for a well woman gyn exam. She had right hip replaced in April by Dr Percell Miller and doing great. She has cut back on smoking using gum.  Lab Results  Component Value Date   DIAGPAP  10/04/2019    - Negative for intraepithelial lesion or malignancy (NILM)   [place] Negative 10/04/2019   PCP is Amy Luciana Axe PA.  Current Medications, Allergies, Past Medical History, Past Surgical History, Family History and Social History were reviewed in [employer] record.     Review of Systems: Patient denies any headaches, hearing loss, fatigue, blurred vision, shortness of breath, chest pain, abdominal pain, problems with bowel movements, urination,(UI at times), or intercourse. No joint pain or mood swings.     Physical Exam:BP 129/76 (BP Location: Right Arm, Patient Position: Sitting, Cuff Size: Normal)   Pulse 96   Ht 5\' 1"  (1.549 m)   Wt 140 lb (63.5 kg)   BMI 26.45 kg/m   General:  Well developed, well nourished, no acute distress Skin:  Warm and dry Neck:  Midline trachea, normal thyroid, good ROM, no lymphadenopathy,no carotid bruits heard. Lungs; Clear to auscultation bilaterally Breast:  No dominant palpable mass, retraction, or nipple discharge Cardiovascular: Regular rate and rhythm Abdomen:  Soft, non tender, no hepatosplenomegaly Pelvic:  External genitalia is normal in appearance, no lesions.  The vagina is pale and atrophic. Urethra has no lesions or masses. The cervix is smooth.  Uterus is felt to be normal size, shape, and contour.  No adnexal masses or tenderness noted.Bladder is non tender, no masses felt. Rectal: Good sphincter tone, no polyps, or hemorrhoids felt.  Hemoccult negative. Extremities/musculoskeletal:  No swelling or varicosities noted, no clubbing or cyanosis Psych:  No mood changes, alert  and cooperative,seems happy AA is 4 Fall risk is low Depression screen [facility] 2/9 10/09/2021 10/08/2020 10/03/2019  Decreased Interest 0 0 0  Down, Depressed, Hopeless 0 0 0  PHQ - 2 Score 0 0 0  Altered sleeping 0 0 -  Tired, decreased energy 0 0 -  Change in appetite 0 0 -  Feeling bad or failure about yourself  0 0 -  Trouble concentrating 0 0 -  Moving slowly or fidgety/restless 0 0 -  Suicidal thoughts 0 0 -  PHQ-9 Score 0 0 -    GAD 7 : Generalized Anxiety Score 10/09/2021 10/08/2020  Nervous, Anxious, on Edge 0 0  Control/stop worrying 0 0  Worry too much - different things 0 0  Trouble relaxing 0 0  Restless 0 0  Easily annoyed or irritable 0 0  Afraid - awful might happen 0 0  Total GAD 7 Score 0 0    Upstream - 10/09/21 1047       Pregnancy Intention Screening   Does the patient want to become pregnant in the next year? No    Does the patient's partner want to become pregnant in the next year? No      Contraception Wrap Up   Current Method --   PM   End Method --   PM   Contraception Counseling Provided No            Examination chaperoned by Celene Squibb LPN    Impression and Plan:  1. Encounter for well woman exam with routine gynecological exam Pap and  physical in 1 year Mammogram in February Colonoscopy per GI Labs with PCP  2. Encounter for screening fecal occult blood testing

## 2021-11-17 ENCOUNTER — Ambulatory Visit (HOSPITAL_COMMUNITY): Payer: 59

## 2021-12-18 DIAGNOSIS — D649 Anemia, unspecified: Secondary | ICD-10-CM | POA: Diagnosis not present

## 2021-12-18 DIAGNOSIS — E7849 Other hyperlipidemia: Secondary | ICD-10-CM | POA: Diagnosis not present

## 2021-12-18 DIAGNOSIS — I10 Essential (primary) hypertension: Secondary | ICD-10-CM | POA: Diagnosis not present

## 2021-12-18 DIAGNOSIS — E119 Type 2 diabetes mellitus without complications: Secondary | ICD-10-CM | POA: Diagnosis not present

## 2021-12-18 DIAGNOSIS — E782 Mixed hyperlipidemia: Secondary | ICD-10-CM | POA: Diagnosis not present

## 2021-12-19 ENCOUNTER — Other Ambulatory Visit: Payer: Self-pay

## 2021-12-19 ENCOUNTER — Ambulatory Visit (HOSPITAL_COMMUNITY)
Admission: RE | Admit: 2021-12-19 | Discharge: 2021-12-19 | Disposition: A | Discharge status: Home / Self Care | Payer: Medicare HMO | Source: Ambulatory Visit | Attending: General Practice | Admitting: General Practice

## 2021-12-19 DIAGNOSIS — Z1231 Encounter for screening mammogram for malignant neoplasm of breast: Secondary | ICD-10-CM | POA: Diagnosis not present

## 2021-12-24 DIAGNOSIS — R7301 Impaired fasting glucose: Secondary | ICD-10-CM | POA: Diagnosis not present

## 2021-12-24 DIAGNOSIS — Z1389 Encounter for screening for other disorder: Secondary | ICD-10-CM | POA: Diagnosis not present

## 2021-12-24 DIAGNOSIS — I10 Essential (primary) hypertension: Secondary | ICD-10-CM | POA: Diagnosis not present

## 2021-12-24 DIAGNOSIS — Z6826 Body mass index (BMI) 26.0-26.9, adult: Secondary | ICD-10-CM | POA: Diagnosis not present

## 2021-12-24 DIAGNOSIS — R69 Illness, unspecified: Secondary | ICD-10-CM | POA: Diagnosis not present

## 2021-12-24 DIAGNOSIS — E7849 Other hyperlipidemia: Secondary | ICD-10-CM | POA: Diagnosis not present

## 2021-12-24 DIAGNOSIS — Z1331 Encounter for screening for depression: Secondary | ICD-10-CM | POA: Diagnosis not present

## 2022-01-21 DIAGNOSIS — F17210 Nicotine dependence, cigarettes, uncomplicated: Secondary | ICD-10-CM | POA: Diagnosis not present

## 2022-01-21 DIAGNOSIS — I10 Essential (primary) hypertension: Secondary | ICD-10-CM | POA: Diagnosis not present

## 2022-01-21 DIAGNOSIS — R69 Illness, unspecified: Secondary | ICD-10-CM | POA: Diagnosis not present

## 2022-01-21 DIAGNOSIS — F419 Anxiety disorder, unspecified: Secondary | ICD-10-CM | POA: Diagnosis not present

## 2022-01-21 DIAGNOSIS — H43393 Other vitreous opacities, bilateral: Secondary | ICD-10-CM | POA: Diagnosis not present

## 2022-01-21 DIAGNOSIS — Z6826 Body mass index (BMI) 26.0-26.9, adult: Secondary | ICD-10-CM | POA: Diagnosis not present

## 2022-02-04 DIAGNOSIS — M1611 Unilateral primary osteoarthritis, right hip: Secondary | ICD-10-CM | POA: Diagnosis not present

## 2022-02-04 DIAGNOSIS — M545 Low back pain, unspecified: Secondary | ICD-10-CM | POA: Diagnosis not present

## 2022-03-18 DIAGNOSIS — M1611 Unilateral primary osteoarthritis, right hip: Secondary | ICD-10-CM | POA: Diagnosis not present

## 2022-04-06 ENCOUNTER — Ambulatory Visit (INDEPENDENT_AMBULATORY_CARE_PROVIDER_SITE_OTHER): Payer: 59 | Admitting: Gastroenterology

## 2022-04-16 IMAGING — RF DG HIP (WITH PELVIS) OPERATIVE*R*
1 series · 2 of 2 positions shown · non-contrast
Comparison: 07/19/2020.

CLINICAL DATA: Total hip replacement.

EXAM:
OPERATIVE RIGHT HIP (WITH PELVIS IF PERFORMED) 2 VIEWS
TECHNIQUE: Fluoroscopic spot image(s) were submitted for interpretation
post-operatively.

[Series 1: unknown protocol · 0.20mm/px · 2 of 2 slices shown]
[im 1/2]
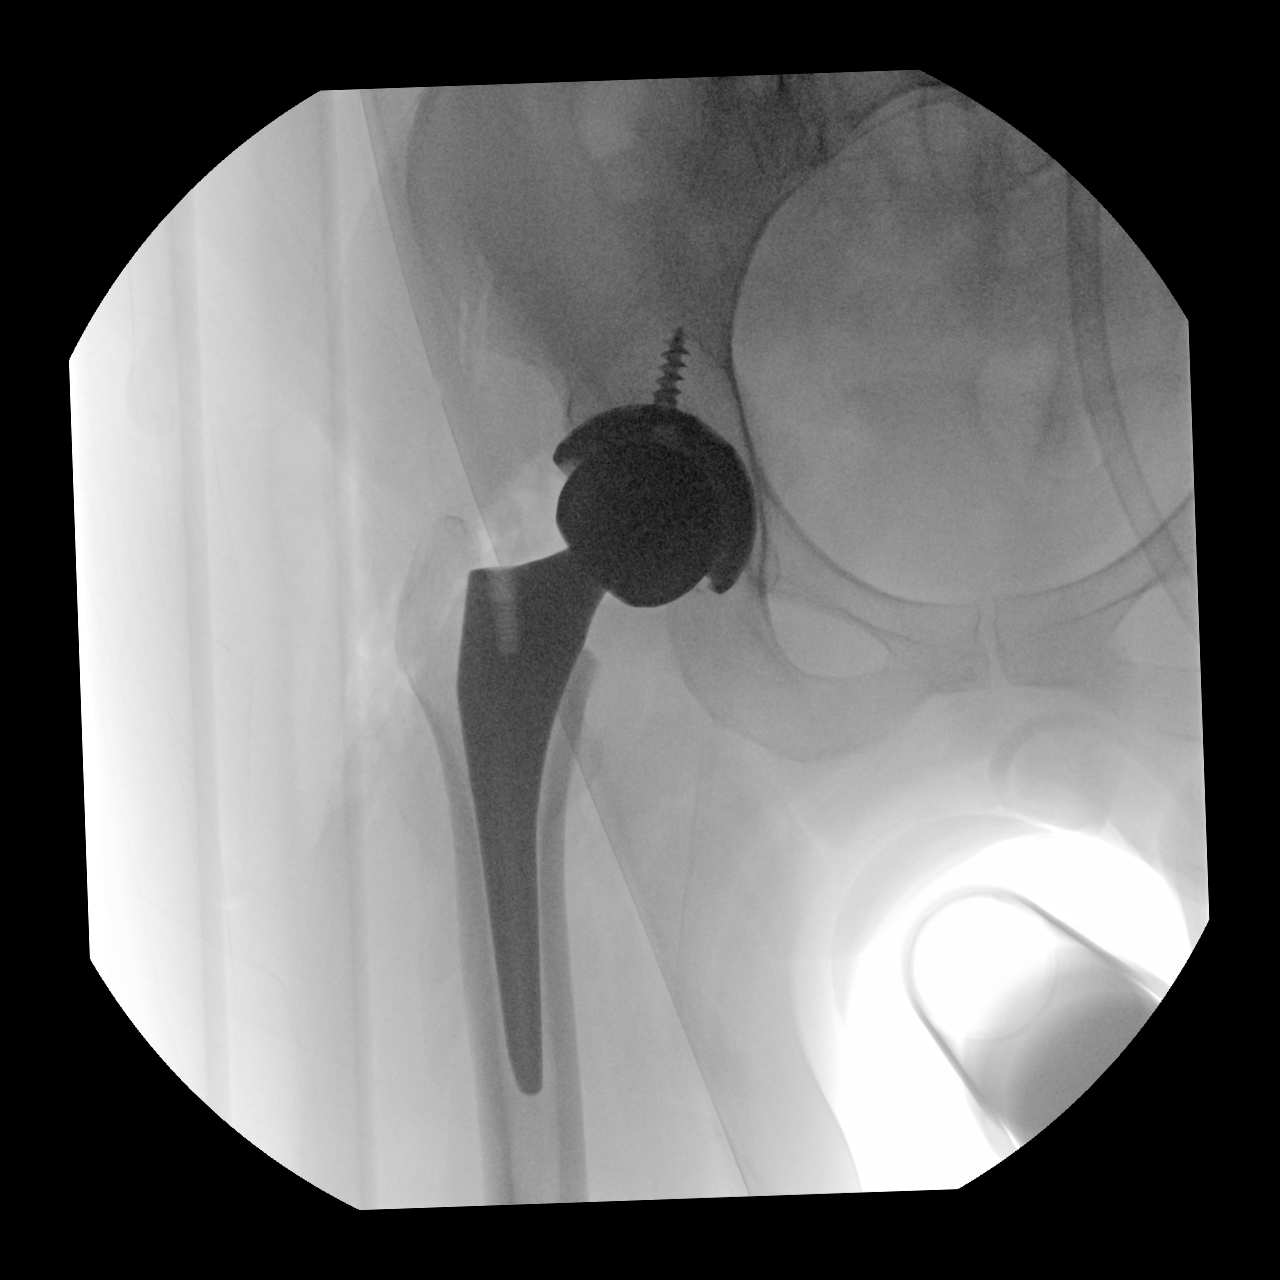
[im 2/2]
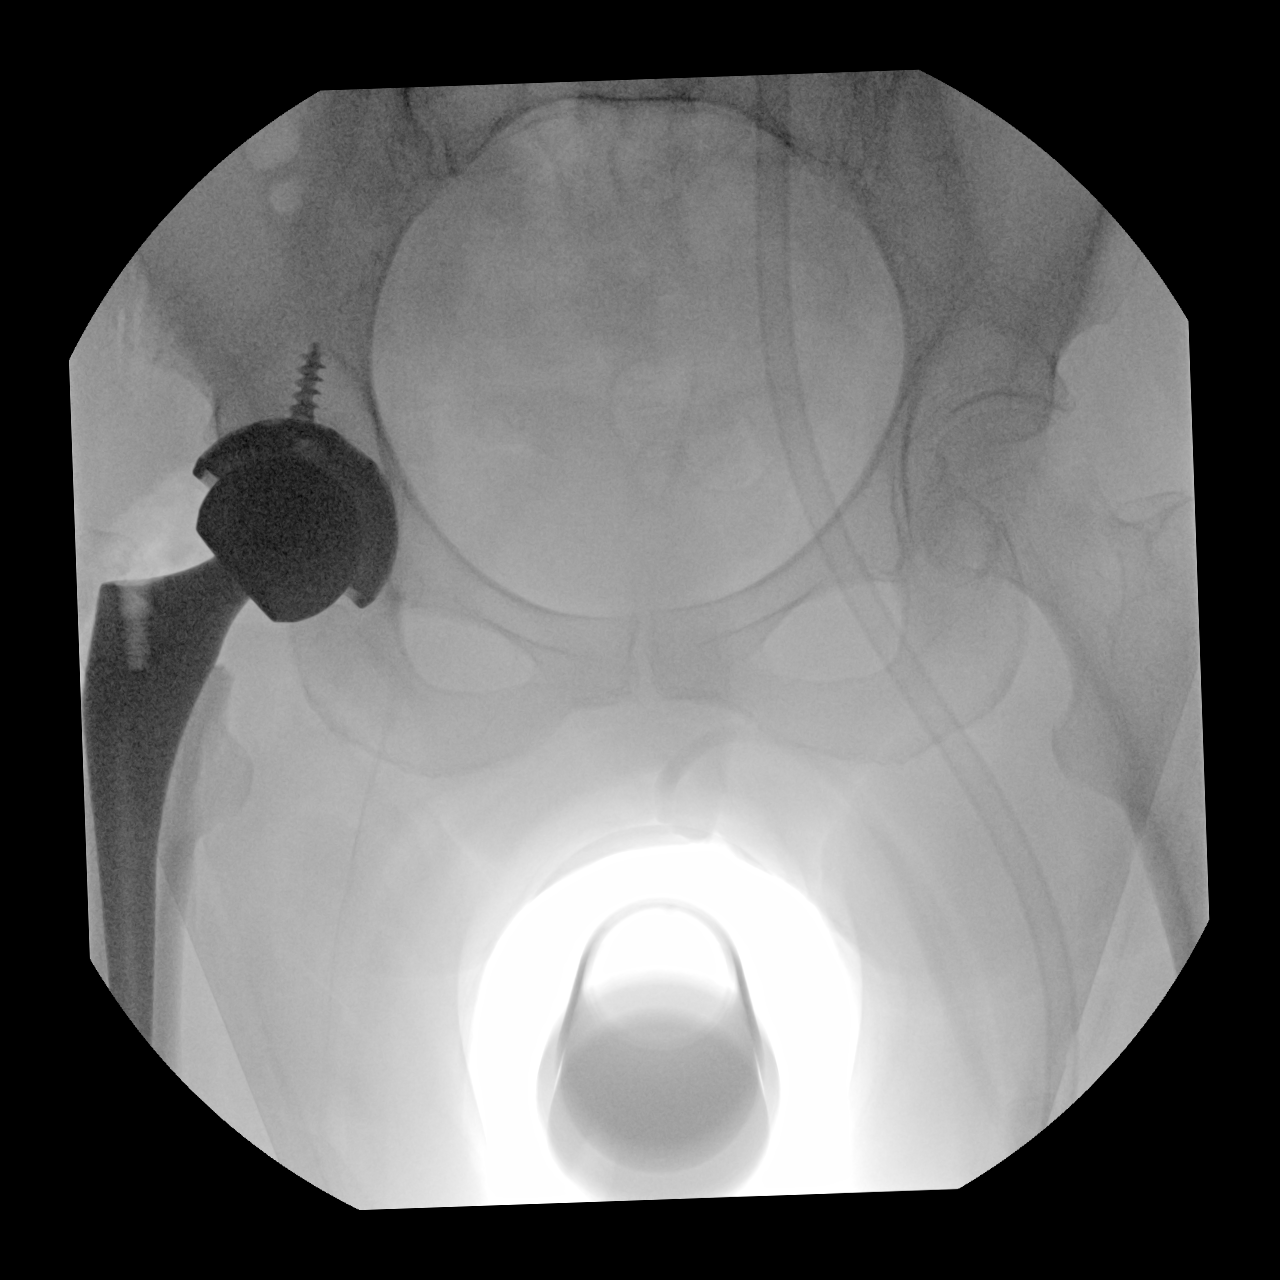

[2 of 2 positions shown; findings below may reference images not displayed]

FINDINGS: Total right hip replacement. Hardware intact. Anatomic alignment. No
acute bony abnormality.
IMPRESSION: Total right hip replacement.  Hardware intact.  Anatomic alignment.

## 2022-04-16 IMAGING — DX DG HIP (WITH OR WITHOUT PELVIS) 1V PORT*R*
2 series · 2 of 2 positions shown · non-contrast
Comparison: Intraoperative right hip radiographs [DATE] [DATE], [DATE];
[DATE] [DATE], [DATE]

CLINICAL DATA: Status post right total hip replacement

EXAM:
DG HIP (WITH OR WITHOUT PELVIS) 1V PORT RIGHT

[pelvis ap]
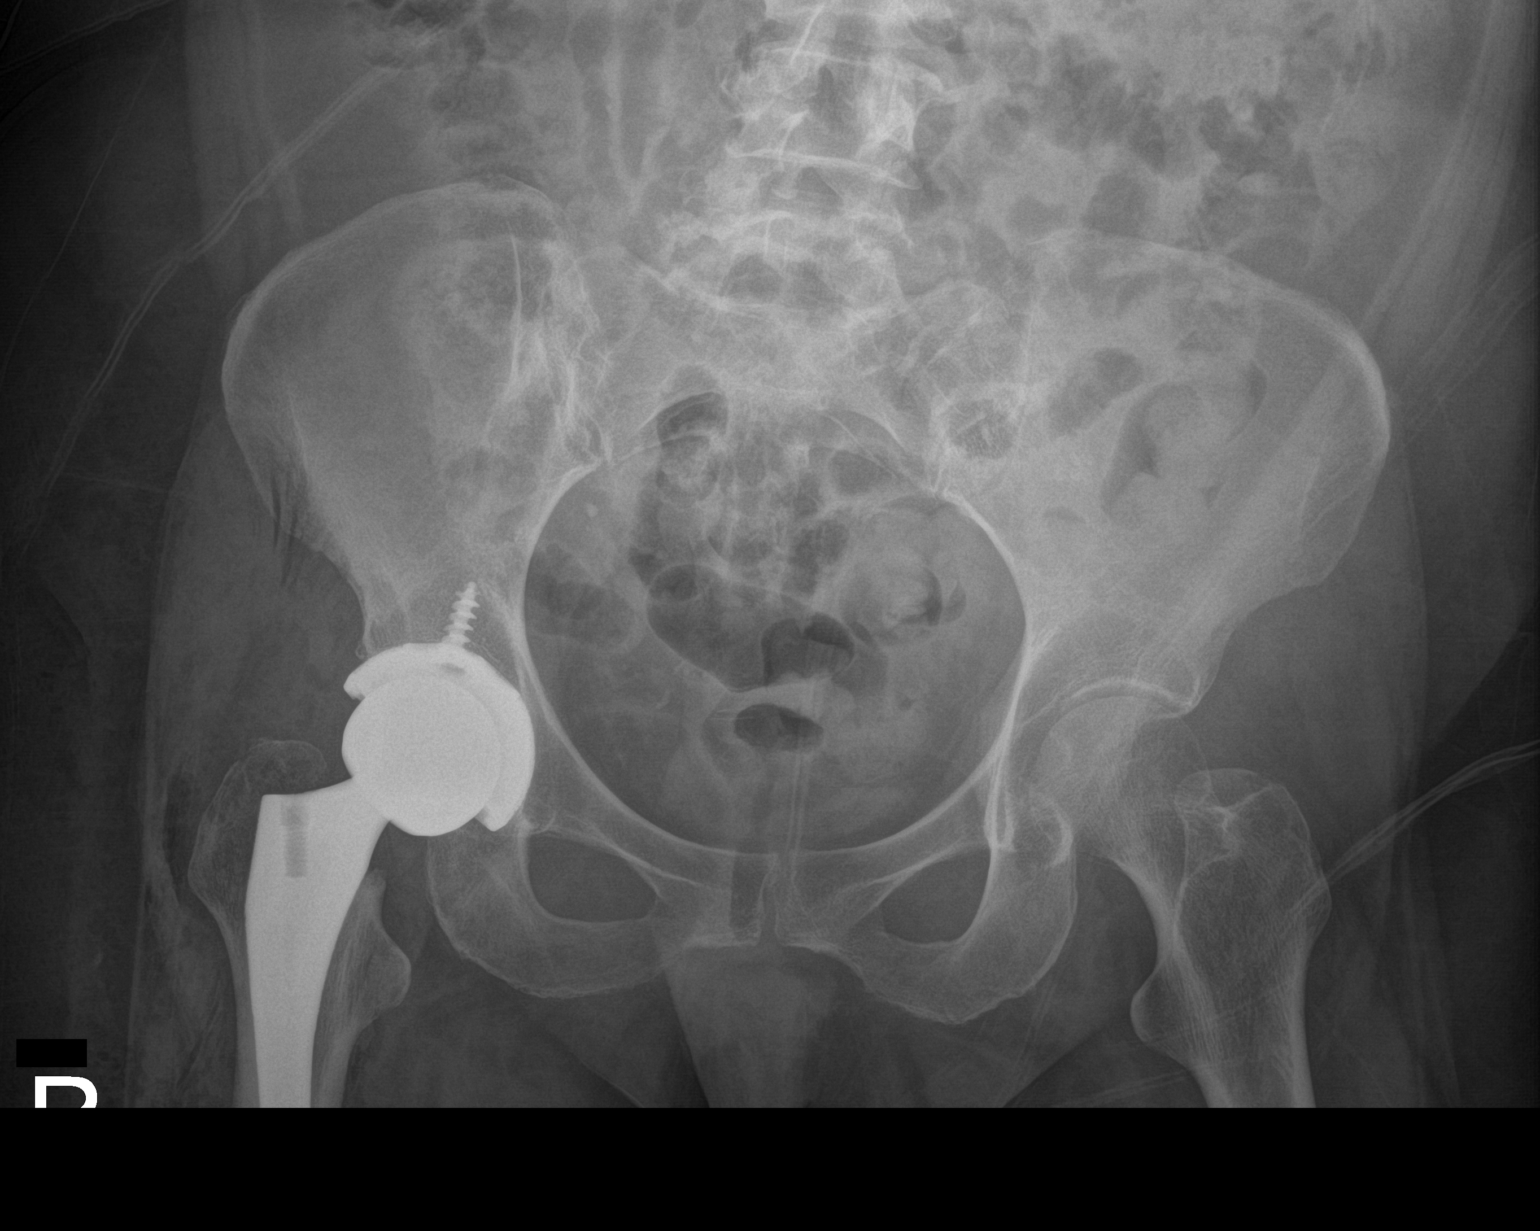

[hip ap]
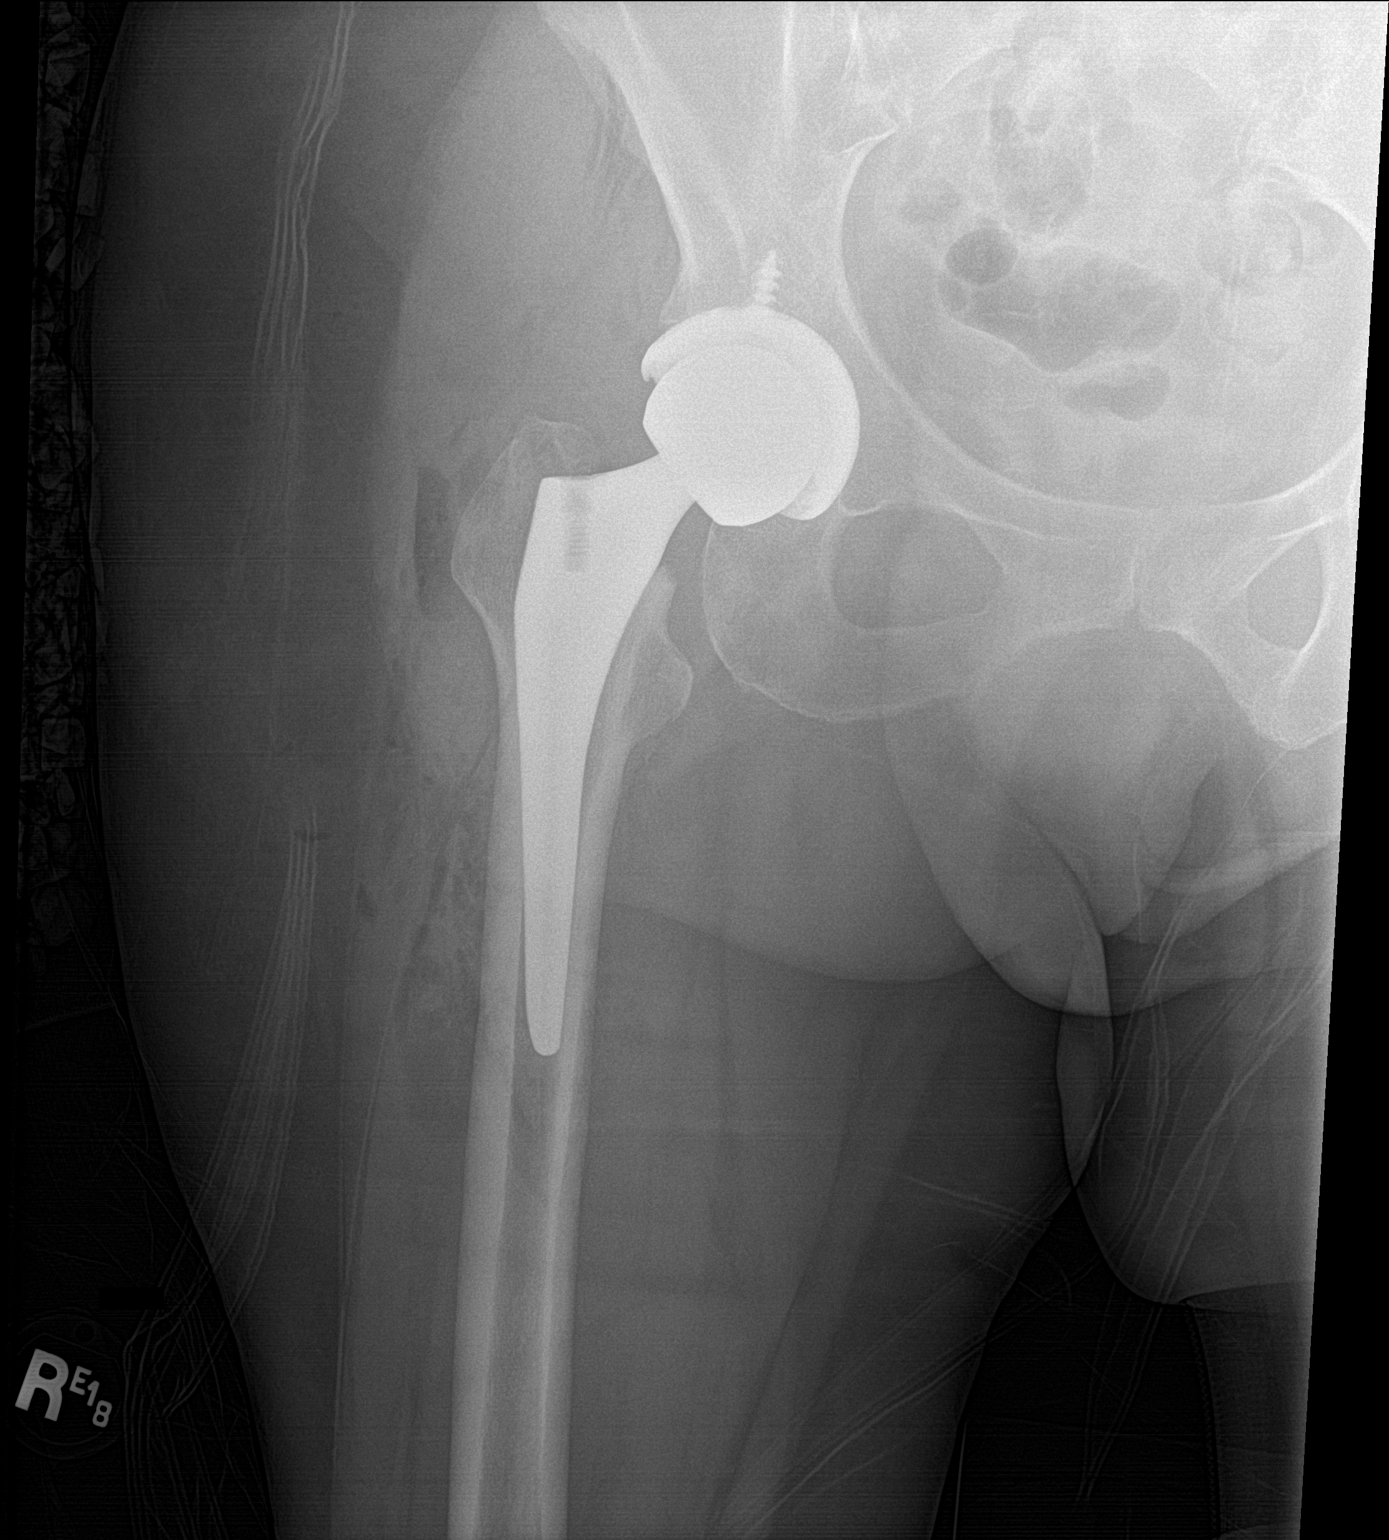

[2 of 2 positions shown; findings below may reference images not displayed]

FINDINGS: Frontal pelvis and frontal right hip images obtained. There is a
total hip replacement on the right with prosthetic components
well-seated on frontal view. No fracture or dislocation. Left hip
joint appears unremarkable. Soft tissue air noted on the right.
IMPRESSION: Total hip replacement on the right with prosthetic components
well-seated on frontal view. No fracture or dislocation. Acute
postoperative changes on the right noted. Unremarkable left hip
joint on frontal view.

## 2022-04-24 DIAGNOSIS — E782 Mixed hyperlipidemia: Secondary | ICD-10-CM | POA: Diagnosis not present

## 2022-04-24 DIAGNOSIS — E7849 Other hyperlipidemia: Secondary | ICD-10-CM | POA: Diagnosis not present

## 2022-04-24 DIAGNOSIS — I10 Essential (primary) hypertension: Secondary | ICD-10-CM | POA: Diagnosis not present

## 2022-04-24 DIAGNOSIS — D649 Anemia, unspecified: Secondary | ICD-10-CM | POA: Diagnosis not present

## 2022-04-24 DIAGNOSIS — E119 Type 2 diabetes mellitus without complications: Secondary | ICD-10-CM | POA: Diagnosis not present

## 2022-04-24 DIAGNOSIS — K219 Gastro-esophageal reflux disease without esophagitis: Secondary | ICD-10-CM | POA: Diagnosis not present

## 2022-04-28 DIAGNOSIS — F17210 Nicotine dependence, cigarettes, uncomplicated: Secondary | ICD-10-CM | POA: Diagnosis not present

## 2022-04-28 DIAGNOSIS — R69 Illness, unspecified: Secondary | ICD-10-CM | POA: Diagnosis not present

## 2022-04-28 DIAGNOSIS — I10 Essential (primary) hypertension: Secondary | ICD-10-CM | POA: Diagnosis not present

## 2022-04-28 DIAGNOSIS — F419 Anxiety disorder, unspecified: Secondary | ICD-10-CM | POA: Diagnosis not present

## 2022-04-28 DIAGNOSIS — E7849 Other hyperlipidemia: Secondary | ICD-10-CM | POA: Diagnosis not present

## 2022-04-28 DIAGNOSIS — R7301 Impaired fasting glucose: Secondary | ICD-10-CM | POA: Diagnosis not present

## 2022-04-28 DIAGNOSIS — Z6825 Body mass index (BMI) 25.0-25.9, adult: Secondary | ICD-10-CM | POA: Diagnosis not present

## 2022-08-24 DIAGNOSIS — E782 Mixed hyperlipidemia: Secondary | ICD-10-CM | POA: Diagnosis not present

## 2022-08-24 DIAGNOSIS — E119 Type 2 diabetes mellitus without complications: Secondary | ICD-10-CM | POA: Diagnosis not present

## 2022-08-24 DIAGNOSIS — R7301 Impaired fasting glucose: Secondary | ICD-10-CM | POA: Diagnosis not present

## 2022-08-24 DIAGNOSIS — E7849 Other hyperlipidemia: Secondary | ICD-10-CM | POA: Diagnosis not present

## 2022-08-25 ENCOUNTER — Encounter (INDEPENDENT_AMBULATORY_CARE_PROVIDER_SITE_OTHER): Payer: Self-pay | Admitting: *Deleted

## 2022-09-01 DIAGNOSIS — E7849 Other hyperlipidemia: Secondary | ICD-10-CM | POA: Diagnosis not present

## 2022-09-01 DIAGNOSIS — R7301 Impaired fasting glucose: Secondary | ICD-10-CM | POA: Diagnosis not present

## 2022-09-01 DIAGNOSIS — I10 Essential (primary) hypertension: Secondary | ICD-10-CM | POA: Diagnosis not present

## 2022-09-04 DIAGNOSIS — Z23 Encounter for immunization: Secondary | ICD-10-CM | POA: Diagnosis not present

## 2022-09-10 ENCOUNTER — Telehealth (INDEPENDENT_AMBULATORY_CARE_PROVIDER_SITE_OTHER): Payer: Self-pay | Admitting: *Deleted

## 2022-09-10 MED ORDER — PEG 3350-KCL-NA BICARB-NACL 420 G PO SOLR: 4000.0000 mL | Freq: Once | ORAL | 0 refills | Status: AC

## 2022-09-10 NOTE — Telephone Encounter (Signed)
Spoke with pt. Scheduled room 1 on 12/20 at 9:15am. Pt aware will mail prep instructions. Rx for prep sent to pharmacy. Confirmed address and pharmacy.

## 2022-09-10 NOTE — Telephone Encounter (Signed)
Recall, no referral needed

## 2022-09-10 NOTE — Telephone Encounter (Signed)
  Procedure: Colonoscopy   Have you had a colonoscopy before?  Yes age 66 and 39  Do you have family history of colon cancer? Father   Previous colonoscopy with polyps removed? yes  Do you have a history colorectal cancer?   no  Are you diabetic?  no  Do you have a prosthetic or mechanical heart valve? no  Do you have a pacemaker/defibrillator?   no  Have you had endocarditis/atrial fibrillation?  no  Do you use supplemental oxygen/CPAP?  no  Have you had joint replacement within the last 12 months?  no  Do you tend to be constipated or have to use laxatives?  no   Do you have history of alcohol use? If yes, how much and how often.  no  Do you have history or are you using drugs? If yes, what do are you  using?  no  Have you ever had a stroke/heart attack?  no  Do you take weight loss medication? no  female patients,: have you had a hysterectomy? no                              are you post menopausal?  no                              do you still have your menstrual cycle? no     Do you take any blood-thinning medications such as: (Plavix, aspirin, Coumadin, Aggrenox, Brilinta, Xarelto, Eliquis, Pradaxa, Savaysa or Effient)? no  If yes we need the name, milligram, dosage and who is prescribing doctor:               Current Outpatient Medications  Medication Sig Dispense Refill   Ascorbic Acid (VITA-C PO) Take by mouth daily.     Cholecalciferol (VITAMIN D-3 PO) Take 2,000 Units by mouth daily.     Coenzyme Q10 (CO Q 10) 100 MG CAPS Take 100 mg by mouth daily.     olmesartan (BENICAR) 5 MG tablet Take 5 mg by mouth daily.     Omega-3 Fatty Acids (FISH OIL) 1000 MG CAPS Take 1,000 mg by mouth daily.     pantoprazole (PROTONIX) 40 MG tablet Take 1 tablet (40 mg total) by mouth daily. 90 tablet 3   Polyethyl Glycol-Propyl Glycol (SYSTANE OP) Place 1 drop into both eyes daily as needed (dry eyes).     Probiotic Product (PROBIOTIC-10 PO) Take 1 capsule by mouth daily.      sertraline (ZOLOFT) 50 MG tablet Take 50 mg by mouth daily.     simvastatin (ZOCOR) 40 MG tablet Take 40 mg by mouth every evening.     VENTOLIN HFA 108 (90 Base) MCG/ACT inhaler Inhale 2 puffs into the lungs every 6 (six) hours as needed for wheezing or shortness of breath.     No current facility-administered medications for this visit.    No Known Allergies  Pharmacy: [facility]

## 2022-09-16 DIAGNOSIS — R42 Dizziness and giddiness: Secondary | ICD-10-CM | POA: Diagnosis not present

## 2022-09-16 DIAGNOSIS — R519 Headache, unspecified: Secondary | ICD-10-CM | POA: Diagnosis not present

## 2022-09-16 DIAGNOSIS — I10 Essential (primary) hypertension: Secondary | ICD-10-CM | POA: Diagnosis not present

## 2022-09-16 DIAGNOSIS — R55 Syncope and collapse: Secondary | ICD-10-CM | POA: Diagnosis not present

## 2022-09-16 DIAGNOSIS — Z6825 Body mass index (BMI) 25.0-25.9, adult: Secondary | ICD-10-CM | POA: Diagnosis not present

## 2022-09-16 DIAGNOSIS — R5383 Other fatigue: Secondary | ICD-10-CM | POA: Diagnosis not present

## 2022-09-16 DIAGNOSIS — R69 Illness, unspecified: Secondary | ICD-10-CM | POA: Diagnosis not present

## 2022-09-22 DIAGNOSIS — R519 Headache, unspecified: Secondary | ICD-10-CM | POA: Diagnosis not present

## 2022-09-22 DIAGNOSIS — R69 Illness, unspecified: Secondary | ICD-10-CM | POA: Diagnosis not present

## 2022-09-22 DIAGNOSIS — Z87891 Personal history of nicotine dependence: Secondary | ICD-10-CM | POA: Diagnosis not present

## 2022-09-23 ENCOUNTER — Other Ambulatory Visit (INDEPENDENT_AMBULATORY_CARE_PROVIDER_SITE_OTHER): Payer: Self-pay

## 2022-09-23 DIAGNOSIS — K219 Gastro-esophageal reflux disease without esophagitis: Secondary | ICD-10-CM

## 2022-09-23 MED ORDER — PANTOPRAZOLE SODIUM 40 MG PO TBEC: 40.0000 mg | DELAYED_RELEASE_TABLET | Freq: Every day | ORAL | 3 refills | Status: DC

## 2022-10-14 ENCOUNTER — Encounter: Payer: Self-pay | Admitting: *Deleted

## 2022-10-15 ENCOUNTER — Ambulatory Visit: Payer: Medicare HMO | Attending: Internal Medicine | Admitting: Internal Medicine

## 2022-10-15 ENCOUNTER — Encounter: Payer: Self-pay | Admitting: Internal Medicine

## 2022-10-15 VITALS — BP 124/70 | HR 82 | Wt 135.4 lb

## 2022-10-15 DIAGNOSIS — R42 Dizziness and giddiness: Secondary | ICD-10-CM | POA: Diagnosis not present

## 2022-10-15 DIAGNOSIS — I10 Essential (primary) hypertension: Secondary | ICD-10-CM

## 2022-10-15 DIAGNOSIS — I2510 Atherosclerotic heart disease of native coronary artery without angina pectoris: Secondary | ICD-10-CM | POA: Diagnosis not present

## 2022-10-15 MED ORDER — METOPROLOL TARTRATE 100 MG PO TABS: 100.0000 mg | ORAL_TABLET | Freq: Once | ORAL | 0 refills | Status: DC

## 2022-10-15 MED ORDER — NITROGLYCERIN 0.4 MG SL SUBL: 0.4000 mg | SUBLINGUAL_TABLET | SUBLINGUAL | 1 refills | Status: AC | PRN

## 2022-10-15 NOTE — Patient Instructions (Addendum)
Medication Instructions:  Your physician has recommended you make the following change in your medication:  Nitroglycerin 0.4 mg prescription sent to your pharmacy. Dissolve one under tongue for chest pain every 5 minutes up to 3 doses. If no relief, proceed to ED. Continue other medications the same  Labwork: BMET (please do in the next 2 weeks before your CT)  Non-fasting [employer] or [employer] ([place]. [place])  Testing/Procedures: Your physician has requested that you have an echocardiogram. Echocardiography is a painless test that uses sound waves to create images of your heart. It provides your doctor with information about the size and shape of your heart and how well your heart's chambers and valves are working. This procedure takes approximately one hour. There are no restrictions for this procedure. Please do NOT wear cologne, perfume, aftershave, or lotions (deodorant is allowed). Please arrive 15 minutes prior to your appointment time. Coronary CTA-see instructions below  Follow-Up: Your physician recommends that you schedule a follow-up appointment in: 1 year. You will receive a reminder call in the mail in about 10 months reminding you to call and schedule your appointment. If you don't receive this call, please contact our office.  Any Other Special Instructions Will Be Listed Below (If Applicable).  If you need a refill on your cardiac medications before your next appointment, please call your pharmacy.    Your cardiac CT will be scheduled at one of the below locations:   [facility] [address] [place], [place] [address] [phone]  If scheduled at [facility], please arrive at the [facility] and Children's Entrance (Entrance C2) of [facility] 30 minutes prior to test start time. You can use the FREE valet parking offered at entrance C (encouraged to control the heart rate for the test)  Proceed to the [facility]  Radiology Department (first floor) to check-in and test prep.  All radiology patients and guests should use entrance C2 at [facility], accessed from [facility], even though the hospital's physical address listed is [address]    Please follow these instructions carefully (unless otherwise directed):  On the Night Before the Test: Be sure to Drink plenty of water. Do not consume any caffeinated/decaffeinated beverages or chocolate 12 hours prior to your test. Do not take any antihistamines 12 hours prior to your test. If the patient has contrast allergy: No contrast allergy  On the Day of the Test: Drink plenty of water until 1 hour prior to the test. Do not eat any food 1 hour prior to test. You may take your regular medications prior to the test.  Take metoprolol (Lopressor) two hours prior to test. FEMALES- please wear underwire-free bra if available, avoid dresses & tight clothing    After the Test: Drink plenty of water. After receiving IV contrast, you may experience a mild flushed feeling. This is normal. On occasion, you may experience a mild rash up to 24 hours after the test. This is not dangerous. If this occurs, you can take Benadryl 25 mg and increase your fluid intake. If you experience trouble breathing, this can be serious. If it is severe call 911 IMMEDIATELY. If it is mild, please call our office.  We will call to schedule your test 2-4 weeks out understanding that some insurance companies will need an authorization prior to the service being performed.   For non-scheduling related questions, please contact the cardiac imaging nurse navigator should you have  any questions/concerns: Marchia Bond, Cardiac Imaging Nurse Navigator Gordy Clement, Cardiac Imaging Nurse Navigator [facility] Direct Office Dial: [phone]   For scheduling needs, including cancellations and rescheduling, please call [place],  [phone].
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
## 2022-10-16 ENCOUNTER — Ambulatory Visit (INDEPENDENT_AMBULATORY_CARE_PROVIDER_SITE_OTHER): Payer: Medicare HMO | Admitting: Adult Health

## 2022-10-16 ENCOUNTER — Encounter: Payer: Self-pay | Admitting: Adult Health

## 2022-10-16 ENCOUNTER — Other Ambulatory Visit (HOSPITAL_COMMUNITY)
Admission: RE | Admit: 2022-10-16 | Discharge: 2022-10-16 | Disposition: A | Discharge status: Home / Self Care | Payer: Medicare HMO | Source: Ambulatory Visit | Attending: Adult Health | Admitting: Adult Health

## 2022-10-16 VITALS — BP 118/76 | HR 82 | Ht 61.0 in | Wt 135.0 lb

## 2022-10-16 DIAGNOSIS — Z1211 Encounter for screening for malignant neoplasm of colon: Secondary | ICD-10-CM

## 2022-10-16 DIAGNOSIS — Z01419 Encounter for gynecological examination (general) (routine) without abnormal findings: Secondary | ICD-10-CM

## 2022-10-16 DIAGNOSIS — I10 Essential (primary) hypertension: Secondary | ICD-10-CM | POA: Insufficient documentation

## 2022-10-16 DIAGNOSIS — Z1151 Encounter for screening for human papillomavirus (HPV): Secondary | ICD-10-CM | POA: Diagnosis not present

## 2022-10-16 DIAGNOSIS — R69 Illness, unspecified: Secondary | ICD-10-CM | POA: Diagnosis not present

## 2022-10-16 LAB — HEMOCCULT GUIAC POC 1CARD (OFFICE): Fecal Occult Blood, POC: NEGATIVE

## 2022-10-16 NOTE — Progress Notes (Signed)
Patient ID: Barbara Phillips, female   DOB: June 16, 1956, 66 y.o.   MRN: [id number] History of Present Illness: Barbara Phillips is a 66 year old white female,married, PM in for a well woman gyn exam and pap.  PCP is Kassie Mends PA.   Current Medications, Allergies, Past Medical History, Past Surgical History, Family History and Social History were reviewed in [employer] record.     Review of Systems:  Patient denies any headaches, hearing loss, fatigue, blurred vision, shortness of breath, chest pain, abdominal pain, problems with bowel movements, urination, or intercourse(not active). No joint pain or mood swings.    Physical Exam:BP 118/76 (BP Location: Left Arm, Patient Position: Sitting, Cuff Size: Normal)   Pulse 82   Ht '5\' 1"'$  (1.549 m)   Wt 135 lb (61.2 kg)   BMI 25.51 kg/m   General:  Well developed, well nourished, no acute distress Skin:  Warm and dry Neck:  Midline trachea, normal thyroid, good ROM, no lymphadenopathy,no carotid bruits heard Lungs; Clear to auscultation bilaterally Breast:  No dominant palpable mass, retraction, or nipple discharge Cardiovascular: Regular rate and rhythm Abdomen:  Soft, non tender, no hepatosplenomegaly Pelvic:  External genitalia is normal in appearance, no lesions.  The vagina is pale with loss of moisture and rugae. Urethra has no lesions or masses. The cervix is smooth, pap with HR HPV genotyping performed.  Uterus is felt to be normal size, shape, and contour.  No adnexal masses or tenderness noted.Bladder is non tender, no masses felt. Rectal: Good sphincter tone, no polyps, or hemorrhoids felt.  Hemoccult negative. Extremities/musculoskeletal:  No swelling or varicosities noted, no clubbing or cyanosis Psych:  No mood changes, alert and cooperative,seems happy AA is 3 Fall risk is low    10/16/2022    9:43 AM 10/09/2021   10:28 AM 10/08/2020    9:37 AM  Depression screen PHQ 2/9  Decreased Interest 0 0 0  Down, Depressed,  Hopeless 0 0 0  PHQ - 2 Score 0 0 0  Altered sleeping 0 0 0  Tired, decreased energy 1 0 0  Change in appetite 0 0 0  Feeling bad or failure about yourself  0 0 0  Trouble concentrating 0 0 0  Moving slowly or fidgety/restless 0 0 0  Suicidal thoughts 0 0 0  PHQ-9 Score 1 0 0       10/16/2022    9:44 AM 10/09/2021   10:28 AM 10/08/2020    9:37 AM  GAD 7 : Generalized Anxiety Score  Nervous, Anxious, on Edge 0 0 0  Control/stop worrying 0 0 0  Worry too much - different things 0 0 0  Trouble relaxing 0 0 0  Restless 0 0 0  Easily annoyed or irritable 0 0 0  Afraid - awful might happen 0 0 0  Total GAD 7 Score 0 0 0    Upstream - 10/16/22 0942       Pregnancy Intention Screening   Does the patient want to become pregnant in the next year? N/A    Does the patient's partner want to become pregnant in the next year? N/A    Would the patient like to discuss contraceptive options today? N/A      Contraception Wrap Up   Current Method No Method - Other Reason   post menopausal   End Method No Method - Other Reason   post menopausal   Contraception Counseling Provided No  Examination chaperoned by Dewitt Hoes RN   Impression and Plan: 1. Encounter for routine gynecological examination with Papanicolaou smear of cervix Pap sent Pap in 3 years if normal Physical in 1 year Labs with PCP Mammogram was negative 12/19/21 Colonoscopy scheduled for 10/21/22  2. Encounter for screening fecal occult blood testing Hemoccult was negative

## 2022-10-19 DIAGNOSIS — I2510 Atherosclerotic heart disease of native coronary artery without angina pectoris: Secondary | ICD-10-CM | POA: Diagnosis not present

## 2022-10-20 LAB — CYTOLOGY - PAP
Adequacy: ABSENT
Comment: NEGATIVE
Diagnosis: NEGATIVE
High risk HPV: NEGATIVE

## 2022-10-21 ENCOUNTER — Telehealth: Payer: Self-pay | Admitting: *Deleted

## 2022-10-21 ENCOUNTER — Ambulatory Visit (HOSPITAL_BASED_OUTPATIENT_CLINIC_OR_DEPARTMENT_OTHER): Payer: Medicare HMO | Admitting: Anesthesiology

## 2022-10-21 ENCOUNTER — Other Ambulatory Visit: Payer: Self-pay

## 2022-10-21 ENCOUNTER — Encounter (INDEPENDENT_AMBULATORY_CARE_PROVIDER_SITE_OTHER): Payer: Self-pay | Admitting: *Deleted

## 2022-10-21 ENCOUNTER — Ambulatory Visit (HOSPITAL_COMMUNITY): Payer: Medicare HMO | Admitting: Anesthesiology

## 2022-10-21 ENCOUNTER — Encounter (HOSPITAL_COMMUNITY): Payer: Self-pay | Admitting: Gastroenterology

## 2022-10-21 ENCOUNTER — Ambulatory Visit (HOSPITAL_COMMUNITY)
Admission: RE | Admit: 2022-10-21 | Discharge: 2022-10-21 | Disposition: A | Discharge status: Home / Self Care | Payer: Medicare HMO | Source: Ambulatory Visit | Attending: Gastroenterology | Admitting: Gastroenterology

## 2022-10-21 ENCOUNTER — Encounter (HOSPITAL_COMMUNITY)
Admission: RE | Disposition: A | Discharge status: Home / Self Care | Payer: Self-pay | Source: Ambulatory Visit | Attending: Gastroenterology

## 2022-10-21 DIAGNOSIS — I10 Essential (primary) hypertension: Secondary | ICD-10-CM | POA: Diagnosis not present

## 2022-10-21 DIAGNOSIS — E782 Mixed hyperlipidemia: Secondary | ICD-10-CM | POA: Diagnosis not present

## 2022-10-21 DIAGNOSIS — Z1211 Encounter for screening for malignant neoplasm of colon: Secondary | ICD-10-CM

## 2022-10-21 DIAGNOSIS — K219 Gastro-esophageal reflux disease without esophagitis: Secondary | ICD-10-CM | POA: Insufficient documentation

## 2022-10-21 DIAGNOSIS — K635 Polyp of colon: Secondary | ICD-10-CM

## 2022-10-21 DIAGNOSIS — Z800 Family history of malignant neoplasm of digestive organs: Secondary | ICD-10-CM | POA: Insufficient documentation

## 2022-10-21 DIAGNOSIS — K589 Irritable bowel syndrome without diarrhea: Secondary | ICD-10-CM | POA: Diagnosis not present

## 2022-10-21 DIAGNOSIS — D123 Benign neoplasm of transverse colon: Secondary | ICD-10-CM | POA: Insufficient documentation

## 2022-10-21 DIAGNOSIS — K648 Other hemorrhoids: Secondary | ICD-10-CM | POA: Insufficient documentation

## 2022-10-21 DIAGNOSIS — F17210 Nicotine dependence, cigarettes, uncomplicated: Secondary | ICD-10-CM | POA: Insufficient documentation

## 2022-10-21 DIAGNOSIS — R69 Illness, unspecified: Secondary | ICD-10-CM | POA: Diagnosis not present

## 2022-10-21 DIAGNOSIS — Z139 Encounter for screening, unspecified: Secondary | ICD-10-CM | POA: Diagnosis not present

## 2022-10-21 HISTORY — PX: COLONOSCOPY WITH PROPOFOL: SHX5780

## 2022-10-21 HISTORY — PX: POLYPECTOMY: SHX5525

## 2022-10-21 LAB — HM COLONOSCOPY

## 2022-10-21 SURGERY — COLONOSCOPY WITH PROPOFOL
Anesthesia: General

## 2022-10-21 MED ORDER — EPHEDRINE SULFATE (PRESSORS) 50 MG/ML IJ SOLN
INTRAMUSCULAR | Status: DC | PRN
  Administered 2022-10-21: 10 mg via INTRAVENOUS

## 2022-10-21 MED ORDER — PROPOFOL 10 MG/ML IV BOLUS
INTRAVENOUS | Status: DC | PRN
  Administered 2022-10-21: 100 mg via INTRAVENOUS

## 2022-10-21 MED ORDER — PROPOFOL 500 MG/50ML IV EMUL
INTRAVENOUS | Status: DC | PRN
  Administered 2022-10-21: 150 ug/kg/min via INTRAVENOUS

## 2022-10-21 MED ORDER — LIDOCAINE HCL 1 % IJ SOLN
INTRAMUSCULAR | Status: DC | PRN
  Administered 2022-10-21: 50 mg via INTRADERMAL

## 2022-10-21 MED ORDER — LACTATED RINGERS IV SOLN: INTRAVENOUS | Status: DC

## 2022-10-21 MED ORDER — LACTATED RINGERS IV SOLN: INTRAVENOUS | Status: DC | PRN

## 2022-10-21 NOTE — Op Note (Signed)
[facility] Patient Name: Barbara Phillips Procedure Date: 10/21/2022 9:34 AM MRN: [id number] Date of Birth: 1955-11-30 Attending MD: Maylon Peppers , , [id number] CSN: [id number] Age: 66 Admit Type: Outpatient Procedure:                Colonoscopy Indications:              Screening for colorectal malignant neoplasm Providers:                Maylon Peppers, Hughie Closs RN, RN, Kristine L.                            Risa Grill, Technician Referring MD:              Medicines:                Monitored Anesthesia Care Complications:            No immediate complications. Estimated Blood Loss:     Estimated blood loss: none. Procedure:                Pre-Anesthesia Assessment:                           - Prior to the procedure, a History and Physical                            was performed, and patient medications, allergies                            and sensitivities were reviewed. The patient's                            tolerance of previous anesthesia was reviewed.                           - The risks and benefits of the procedure and the                            sedation options and risks were discussed with the                            patient. All questions were answered and informed                            consent was obtained.                           - ASA Grade Assessment: II - A patient with mild                            systemic disease.                           After obtaining informed consent, the colonoscope                            was passed under direct vision. Throughout the  procedure, the patient's blood pressure, pulse, and                            oxygen saturations were monitored continuously. The                            [id number]-[id number] ([id number]) was introduced through the                            anus and advanced to the the cecum, identified by                            appendiceal orifice and ileocecal  valve. The                            colonoscopy was performed without difficulty. The                            patient tolerated the procedure well. The quality                            of the bowel preparation was excellent. Scope In: 9:46:36 AM Scope Out: 78:46:96 AM Scope Withdrawal Time: 0 hours 13 minutes 59 seconds  Total Procedure Duration: 0 hours 19 minutes 41 seconds  Findings:      The perianal and digital rectal examinations were normal.      An 8 mm polyp was found in the transverse colon. The polyp was       carpet-like. The polyp was removed with a cold snare. Resection and       retrieval were complete.      Non-bleeding internal hemorrhoids were found during retroflexion. The       hemorrhoids were small. Impression:               - One 8 mm polyp in the transverse colon, removed                            with a cold snare. Resected and retrieved.                           - Non-bleeding internal hemorrhoids. Moderate Sedation:      Per Anesthesia Care Recommendation:           - Discharge patient to home (ambulatory).                           - Resume previous diet.                           - Await pathology results.                           - Repeat colonoscopy for surveillance based on                            pathology results. Procedure Code(s):        --- Professional ---  45385, Colonoscopy, flexible; with removal of                            tumor(s), polyp(s), or other lesion(s) by snare                            technique Diagnosis Code(s):        --- Professional ---                           Z12.11, Encounter for screening for malignant                            neoplasm of colon                           D12.3, Benign neoplasm of transverse colon (hepatic                            flexure or splenic flexure)                           K64.8, Other hemorrhoids CPT copyright 2022 American Medical Association. All  rights reserved. The codes documented in this report are preliminary and upon coder review may  be revised to meet current compliance requirements. Maylon Peppers, MD Maylon Peppers,  10/21/2022 10:12:28 AM This report has been signed electronically. Number of Addenda: 0
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
## 2022-10-21 NOTE — Telephone Encounter (Signed)
-----   Message from Estill Dooms, NP sent at 10/20/2022  4:59 PM EST ----- Let pt know about pap negative HPV and malignancy repeat in 3 years

## 2022-10-21 NOTE — Anesthesia Preprocedure Evaluation (Signed)
Anesthesia Evaluation  Patient identified by MRN, date of birth, ID band Patient awake    Reviewed: Allergy & Precautions, H&P , NPO status , Patient's Chart, lab work & pertinent test results, reviewed documented beta blocker date and time   Airway Mallampati: II  TM Distance: >3 FB Neck ROM: full    Dental no notable dental hx.    Pulmonary neg pulmonary ROS, Current Smoker   Pulmonary exam normal breath sounds clear to auscultation       Cardiovascular Exercise Tolerance: Good hypertension, negative cardio ROS  Rhythm:regular Rate:Normal     Neuro/Psych negative neurological ROS  negative psych ROS   GI/Hepatic Neg liver ROS,GERD  Medicated,,  Endo/Other  negative endocrine ROS    Renal/GU negative Renal ROS  negative genitourinary   Musculoskeletal   Abdominal   Peds  Hematology negative hematology ROS (+)   Anesthesia Other Findings   Reproductive/Obstetrics negative OB ROS                             Anesthesia Physical Anesthesia Plan  ASA: 2  Anesthesia Plan: General   Post-op Pain Management:    Induction:   PONV Risk Score and Plan: Propofol infusion  Airway Management Planned:   Additional Equipment:   Intra-op Plan:   Post-operative Plan:   Informed Consent: I have reviewed the patients History and Physical, chart, labs and discussed the procedure including the risks, benefits and alternatives for the proposed anesthesia with the patient or authorized representative who has indicated his/her understanding and acceptance.     Dental Advisory Given  Plan Discussed with: CRNA  Anesthesia Plan Comments:        Anesthesia Quick Evaluation

## 2022-10-21 NOTE — Discharge Instructions (Addendum)
You are being discharged to home.  Resume your previous diet.  We are waiting for your pathology results.  Your physician has recommended a repeat colonoscopy for surveillance based on pathology results.  

## 2022-10-21 NOTE — Transfer of Care (Signed)
Immediate Anesthesia Transfer of Care Note  Patient: Barbara Phillips  Procedure(s) Performed: COLONOSCOPY WITH PROPOFOL POLYPECTOMY  Patient Location: Endoscopy Unit  Anesthesia Type:General  Level of Consciousness: awake, alert , and oriented  Airway & Oxygen Therapy: Patient Spontanous Breathing  Post-op Assessment: Report given to RN and Post -op Vital signs reviewed and stable  Post vital signs: Reviewed and stable  Last Vitals:  Vitals Value Taken Time  BP 97/81 10/21/22 1010  Temp 36.3 C 10/21/22 1010  Pulse 76 10/21/22 1010  Resp 27 10/21/22 1010  SpO2 97 % 10/21/22 1010    Last Pain:  Vitals:   10/21/22 1010  TempSrc: Oral  PainSc: 0-No pain      Patients Stated Pain Goal: 7 (46/19/01 2224)  Complications: No notable events documented.

## 2022-10-21 NOTE — Telephone Encounter (Signed)
Pt aware pap was negative for HPV and malignancy. Repeat pap in 3 years. Pt voiced understanding. [place]

## 2022-10-22 ENCOUNTER — Other Ambulatory Visit: Payer: Self-pay | Admitting: Internal Medicine

## 2022-10-22 ENCOUNTER — Ambulatory Visit: Payer: Medicare HMO | Attending: Internal Medicine

## 2022-10-22 ENCOUNTER — Telehealth (HOSPITAL_COMMUNITY): Payer: Self-pay | Admitting: *Deleted

## 2022-10-22 DIAGNOSIS — R0789 Other chest pain: Secondary | ICD-10-CM

## 2022-10-22 DIAGNOSIS — I10 Essential (primary) hypertension: Secondary | ICD-10-CM

## 2022-10-22 DIAGNOSIS — I2510 Atherosclerotic heart disease of native coronary artery without angina pectoris: Secondary | ICD-10-CM

## 2022-10-22 DIAGNOSIS — R42 Dizziness and giddiness: Secondary | ICD-10-CM

## 2022-10-22 LAB — ECHOCARDIOGRAM COMPLETE
AR max vel: 1.69 cm2
AV Area VTI: 2.11 cm2
AV Area mean vel: 1.92 cm2
AV Mean grad: 3.8 mmHg
AV Peak grad: 7.5 mmHg
Ao pk vel: 1.37 m/s
Area-P 1/2: 2.63 cm2
Calc EF: 64 %
MV M vel: 2.66 m/s
MV Peak grad: 28.3 mmHg
S' Lateral: 2 cm
Single Plane A2C EF: 66.1 %
Single Plane A4C EF: 67.2 %

## 2022-10-22 LAB — SURGICAL PATHOLOGY

## 2022-10-22 NOTE — Telephone Encounter (Signed)
Reaching out to patient to offer assistance regarding upcoming cardiac imaging study; pt verbalizes understanding of appt date/time, parking situation and where to check in, pre-test NPO status and medications ordered, and verified current allergies; name and call back number provided for further questions should they arise  Sufyaan Palma RN Navigator Cardiac Imaging [facility] [phone] office [phone] cell  Patient to take 100mg metoprolol tartrate two hours prior to her cardiac CT scan. She is aware to arrive at 3pm. 

## 2022-10-23 ENCOUNTER — Ambulatory Visit (HOSPITAL_COMMUNITY)
Admission: RE | Admit: 2022-10-23 | Discharge: 2022-10-23 | Disposition: A | Discharge status: Home / Self Care | Payer: Medicare HMO | Source: Ambulatory Visit | Attending: Internal Medicine | Admitting: Internal Medicine

## 2022-10-23 DIAGNOSIS — I2510 Atherosclerotic heart disease of native coronary artery without angina pectoris: Secondary | ICD-10-CM | POA: Diagnosis not present

## 2022-10-23 DIAGNOSIS — R931 Abnormal findings on diagnostic imaging of heart and coronary circulation: Secondary | ICD-10-CM | POA: Insufficient documentation

## 2022-10-23 MED ORDER — NITROGLYCERIN 0.4 MG SL SUBL
0.8000 mg | SUBLINGUAL_TABLET | Freq: Once | SUBLINGUAL | Status: AC
  Administered 2022-10-23: 0.8 mg via SUBLINGUAL

## 2022-10-23 MED ORDER — NITROGLYCERIN 0.4 MG SL SUBL
SUBLINGUAL_TABLET | SUBLINGUAL | Status: AC
  Filled 2022-10-23: qty 2

## 2022-10-23 MED ORDER — IOHEXOL 350 MG/ML SOLN
100.0000 mL | Freq: Once | INTRAVENOUS | Status: AC | PRN
  Administered 2022-10-23: 100 mL via INTRAVENOUS

## 2022-10-23 NOTE — Anesthesia Postprocedure Evaluation (Signed)
Anesthesia Post Note  Patient: Barbara Phillips  Procedure(s) Performed: COLONOSCOPY WITH PROPOFOL POLYPECTOMY  Patient location during evaluation: Phase II Anesthesia Type: General Level of consciousness: awake Pain management: pain level controlled Vital Signs Assessment: post-procedure vital signs reviewed and stable Respiratory status: spontaneous breathing and respiratory function stable Cardiovascular status: blood pressure returned to baseline and stable Postop Assessment: no headache and no apparent nausea or vomiting Anesthetic complications: no Comments: Late entry   No notable events documented.   Last Vitals:  Vitals:   10/21/22 0807 10/21/22 1010  BP: (!) 107/58 97/81  Pulse: 85 76  Resp: 11 (!) 27  Temp: 36.8 C (!) 36.3 C  SpO2: 99% 97%    Last Pain:  Vitals:   10/21/22 1010  TempSrc: Oral  PainSc: 0-No pain                 Louann Sjogren

## 2022-10-24 ENCOUNTER — Ambulatory Visit (HOSPITAL_BASED_OUTPATIENT_CLINIC_OR_DEPARTMENT_OTHER)
Admission: RE | Admit: 2022-10-24 | Discharge: 2022-10-24 | Disposition: A | Discharge status: Home / Self Care | Payer: Medicare HMO | Source: Ambulatory Visit | Attending: Internal Medicine | Admitting: Internal Medicine

## 2022-10-24 ENCOUNTER — Other Ambulatory Visit: Payer: Self-pay | Admitting: Internal Medicine

## 2022-10-24 DIAGNOSIS — R931 Abnormal findings on diagnostic imaging of heart and coronary circulation: Secondary | ICD-10-CM | POA: Diagnosis not present

## 2022-10-24 DIAGNOSIS — I2510 Atherosclerotic heart disease of native coronary artery without angina pectoris: Secondary | ICD-10-CM | POA: Diagnosis not present

## 2022-10-27 ENCOUNTER — Telehealth: Payer: Self-pay

## 2022-10-27 ENCOUNTER — Telehealth: Payer: Self-pay | Admitting: Internal Medicine

## 2022-10-27 NOTE — Telephone Encounter (Signed)
Patient notified and verbalized understanding. Patient had no questions or concerns at this time. PCP copied 

## 2022-10-27 NOTE — Telephone Encounter (Signed)
-----   Message from Chalmers Guest, MD sent at 10/27/2022 10:02 AM EST ----- Normal pumping function of the heart and no valve abnormalities.

## 2022-10-27 NOTE — Telephone Encounter (Signed)
Pt calling back for ct results

## 2022-10-28 ENCOUNTER — Telehealth: Payer: Self-pay

## 2022-10-28 MED ORDER — ROSUVASTATIN CALCIUM 20 MG PO TABS: 20.0000 mg | ORAL_TABLET | Freq: Every day | ORAL | 3 refills | Status: DC

## 2022-10-28 MED ORDER — ASPIRIN 81 MG PO TBEC: 81.0000 mg | DELAYED_RELEASE_TABLET | Freq: Every day | ORAL | 3 refills | Status: AC

## 2022-10-28 MED ORDER — METOPROLOL TARTRATE 25 MG PO TABS: 25.0000 mg | ORAL_TABLET | Freq: Two times a day (BID) | ORAL | 3 refills | Status: DC

## 2022-10-28 NOTE — Telephone Encounter (Signed)
Spoke to patient who verbalized understanding. Patient had no questions or concerns at this time. PCP copied.

## 2022-10-28 NOTE — Telephone Encounter (Signed)
-----   Message from Chalmers Guest, MD sent at 10/27/2022 11:34 AM EST ----- Start aspirin 81 mg once daily and rosuvastatin 20 mg QHS. Stop simvastatin.

## 2022-10-28 NOTE — Telephone Encounter (Signed)
Mallipeddi, Vishnu P, MD: Patient has significantly elevated coronary calcium score of 849 with moderate CAD in RCA and LAD. Start Metoprolol tartarate 25 mg BID and SL NTG PRN for breakthrough chest pains. Schedule her to be seen in one month with me. If symptoms are refractory to medical management, she will need LHC.   Will call pt back with results.

## 2022-10-28 NOTE — Telephone Encounter (Signed)
Left a message for patient to call office back regarding results.  

## 2022-10-29 ENCOUNTER — Encounter (HOSPITAL_COMMUNITY): Payer: Self-pay | Admitting: Gastroenterology

## 2022-11-04 ENCOUNTER — Other Ambulatory Visit: Payer: Medicare HMO

## 2022-11-04 ENCOUNTER — Other Ambulatory Visit (HOSPITAL_COMMUNITY): Payer: Self-pay | Admitting: General Practice

## 2022-11-04 DIAGNOSIS — Z1231 Encounter for screening mammogram for malignant neoplasm of breast: Secondary | ICD-10-CM

## 2022-11-17 DIAGNOSIS — J069 Acute upper respiratory infection, unspecified: Secondary | ICD-10-CM | POA: Diagnosis not present

## 2022-11-17 DIAGNOSIS — Z6825 Body mass index (BMI) 25.0-25.9, adult: Secondary | ICD-10-CM | POA: Diagnosis not present

## 2022-11-17 DIAGNOSIS — Z20828 Contact with and (suspected) exposure to other viral communicable diseases: Secondary | ICD-10-CM | POA: Diagnosis not present

## 2022-11-17 DIAGNOSIS — R69 Illness, unspecified: Secondary | ICD-10-CM | POA: Diagnosis not present

## 2022-11-17 DIAGNOSIS — R030 Elevated blood-pressure reading, without diagnosis of hypertension: Secondary | ICD-10-CM | POA: Diagnosis not present

## 2022-11-27 ENCOUNTER — Encounter: Payer: Self-pay | Admitting: Internal Medicine

## 2022-11-27 ENCOUNTER — Ambulatory Visit: Payer: Medicare HMO | Attending: Internal Medicine | Admitting: Internal Medicine

## 2022-11-27 ENCOUNTER — Telehealth: Payer: Self-pay | Admitting: Internal Medicine

## 2022-11-27 VITALS — BP 102/67 | HR 72 | Ht 61.0 in | Wt 136.4 lb

## 2022-11-27 DIAGNOSIS — Z01810 Encounter for preprocedural cardiovascular examination: Secondary | ICD-10-CM | POA: Diagnosis not present

## 2022-11-27 DIAGNOSIS — I2510 Atherosclerotic heart disease of native coronary artery without angina pectoris: Secondary | ICD-10-CM

## 2022-11-27 DIAGNOSIS — E7849 Other hyperlipidemia: Secondary | ICD-10-CM | POA: Diagnosis not present

## 2022-11-27 DIAGNOSIS — I25110 Atherosclerotic heart disease of native coronary artery with unstable angina pectoris: Secondary | ICD-10-CM | POA: Diagnosis not present

## 2022-11-27 DIAGNOSIS — I25119 Atherosclerotic heart disease of native coronary artery with unspecified angina pectoris: Secondary | ICD-10-CM | POA: Diagnosis not present

## 2022-11-27 DIAGNOSIS — E785 Hyperlipidemia, unspecified: Secondary | ICD-10-CM | POA: Insufficient documentation

## 2022-11-27 MED ORDER — ISOSORBIDE MONONITRATE ER 30 MG PO TB24: 30.0000 mg | ORAL_TABLET | Freq: Every day | ORAL | 3 refills | Status: DC
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
## 2022-11-27 NOTE — Telephone Encounter (Signed)
Patient states she is returning a call. 

## 2022-11-27 NOTE — Telephone Encounter (Signed)
Patient informed and verbalized understanding of plan. 

## 2022-11-27 NOTE — Patient Instructions (Signed)
Medication Instructions:  Your physician has recommended you make the following change in your medication:  Stop olmesartan Start isosorbide mononitrate 30 mg daily Continue other medications the same  Labwork: BMET & CBC today at [facility]  Testing/Procedures: Your physician has requested that you have a cardiac catheterization. Cardiac catheterization is used to diagnose and/or treat various heart conditions. Doctors may recommend this procedure for a number of different reasons. The most common reason is to evaluate chest pain. Chest pain can be a symptom of coronary artery disease (CAD), and cardiac catheterization can show whether plaque is narrowing or blocking your heart's arteries. This procedure is also used to evaluate the valves, as well as measure the blood flow and oxygen levels in different parts of your heart. For further information please visit [email or web address]. Please follow instruction sheet, as given.  Follow-Up: Your physician recommends that you schedule a follow-up appointment in: 1 month  Any Other Special Instructions Will Be Listed Below (If Applicable).  If you need a refill on your cardiac medications before your next appointment, please call your pharmacy.   [facility] [id number] [place] [address] Dept: [phone] Loc: [place]  11/27/2022  You are scheduled for a Cardiac Catheterization on Wednesday, January 31 with Dr. Lenna Sciara.  1. Please arrive at the [facility] (Main Entrance A) at [facility]: [address] [place], [place] [address] at 11:00 AM (This time is two hours before your procedure to ensure your preparation). Free valet parking service is available.   Special note: Every effort is made to have your procedure done on time. Please understand that emergencies sometimes  delay scheduled procedures.  2. Diet: Do not eat solid foods after midnight.  The patient may have clear liquids until 5am upon the day of the procedure.  3. Labs: You will need to have blood drawn on Friday, January 26 at [facility] You do not need to be fasting.  4. Medication instructions in preparation for your procedure: hold naproxen the day before and day of cath-you may take the rest of your medications that morning   Contrast Allergy: No  Stop taking, Aleve or Naprosyn (Naproxen) Tuesday, January 30,  On the morning of your procedure, take your Aspirin 81 mg and any morning medicines NOT listed above.  You may use sips of water.  5. Plan for one night stay--bring personal belongings. 6. Bring a current list of your medications and current insurance cards. 7. You MUST have a responsible person to drive you home. 8. Someone MUST be with you the first 24 hours after you arrive home or your discharge will be delayed. 9. Please wear clothes that are easy to get on and off and wear slip-on shoes.  Thank you for allowing [place] to care for you!   -- [facility]

## 2022-11-27 NOTE — Telephone Encounter (Signed)
Per Mallipeddi, need BP taken in both arms for comparison.  Left message requesting patient come back to office to have this done.

## 2022-11-30 ENCOUNTER — Telehealth: Payer: Self-pay | Admitting: *Deleted

## 2022-11-30 NOTE — Telephone Encounter (Signed)
Cardiac Catheterization scheduled at [facility] for: Wednesday December 02, 2022 1 PM Arrival time and place: [place] Entrance A at: 11 AM  Nothing to eat after midnight prior to procedure, clear liquids until 5 AM day of procedure.  Medication instructions: -Usual morning medications can be taken with sips of water including aspirin 81 mg.  Confirmed patient has responsible adult to drive home post procedure and be with patient first 24 hours after arriving home.  Patient reports no new symptoms concerning for COVID-19 in the past 10 days.  Reviewed procedure instructions with patient.

## 2022-12-02 ENCOUNTER — Encounter (HOSPITAL_COMMUNITY)
Admission: RE | Disposition: A | Discharge status: Home / Self Care | Payer: Self-pay | Source: Home / Self Care | Attending: Internal Medicine

## 2022-12-02 ENCOUNTER — Other Ambulatory Visit: Payer: Self-pay

## 2022-12-02 ENCOUNTER — Ambulatory Visit (HOSPITAL_COMMUNITY)
Admission: RE | Admit: 2022-12-02 | Discharge: 2022-12-02 | Disposition: A | Discharge status: Home / Self Care | Payer: Medicare HMO | Attending: Internal Medicine | Admitting: Internal Medicine

## 2022-12-02 DIAGNOSIS — F17210 Nicotine dependence, cigarettes, uncomplicated: Secondary | ICD-10-CM | POA: Insufficient documentation

## 2022-12-02 DIAGNOSIS — I10 Essential (primary) hypertension: Secondary | ICD-10-CM | POA: Diagnosis not present

## 2022-12-02 DIAGNOSIS — I2510 Atherosclerotic heart disease of native coronary artery without angina pectoris: Secondary | ICD-10-CM | POA: Diagnosis not present

## 2022-12-02 DIAGNOSIS — Z7982 Long term (current) use of aspirin: Secondary | ICD-10-CM | POA: Insufficient documentation

## 2022-12-02 DIAGNOSIS — E785 Hyperlipidemia, unspecified: Secondary | ICD-10-CM | POA: Diagnosis not present

## 2022-12-02 DIAGNOSIS — I25111 Atherosclerotic heart disease of native coronary artery with angina pectoris with documented spasm: Secondary | ICD-10-CM | POA: Diagnosis not present

## 2022-12-02 DIAGNOSIS — Z79899 Other long term (current) drug therapy: Secondary | ICD-10-CM | POA: Insufficient documentation

## 2022-12-02 DIAGNOSIS — R69 Illness, unspecified: Secondary | ICD-10-CM | POA: Diagnosis not present

## 2022-12-02 HISTORY — PX: LEFT HEART CATH AND CORONARY ANGIOGRAPHY: CATH118249

## 2022-12-02 HISTORY — PX: INTRAVASCULAR ULTRASOUND/IVUS: CATH118244

## 2022-12-02 HISTORY — PX: INTRAVASCULAR PRESSURE WIRE/FFR STUDY: CATH118243

## 2022-12-02 LAB — POCT ACTIVATED CLOTTING TIME
Activated Clotting Time: 271 seconds
Activated Clotting Time: 244 seconds

## 2022-12-02 SURGERY — LEFT HEART CATH AND CORONARY ANGIOGRAPHY
Anesthesia: LOCAL

## 2022-12-02 MED ORDER — FENTANYL CITRATE (PF) 100 MCG/2ML IJ SOLN
INTRAMUSCULAR | Status: DC | PRN
  Administered 2022-12-02 (×3): 25 ug via INTRAVENOUS

## 2022-12-02 MED ORDER — ASPIRIN 81 MG PO CHEW: 81.0000 mg | CHEWABLE_TABLET | ORAL | Status: DC

## 2022-12-02 MED ORDER — HEPARIN (PORCINE) IN NACL 1000-0.9 UT/500ML-% IV SOLN
INTRAVENOUS | Status: AC
  Filled 2022-12-02: qty 500

## 2022-12-02 MED ORDER — SODIUM CHLORIDE 0.9 % WEIGHT BASED INFUSION: 1.0000 mL/kg/h | INTRAVENOUS | Status: DC

## 2022-12-02 MED ORDER — HEPARIN SODIUM (PORCINE) 1000 UNIT/ML IJ SOLN
INTRAMUSCULAR | Status: AC
  Filled 2022-12-02: qty 10

## 2022-12-02 MED ORDER — MIDAZOLAM HCL 2 MG/2ML IJ SOLN
INTRAMUSCULAR | Status: AC
  Filled 2022-12-02: qty 2

## 2022-12-02 MED ORDER — HYDRALAZINE HCL 20 MG/ML IJ SOLN: 10.0000 mg | INTRAMUSCULAR | Status: DC | PRN

## 2022-12-02 MED ORDER — ADENOSINE 12 MG/4ML IV SOLN
INTRAVENOUS | Status: AC
  Filled 2022-12-02: qty 4

## 2022-12-02 MED ORDER — MIDAZOLAM HCL 2 MG/2ML IJ SOLN
INTRAMUSCULAR | Status: DC | PRN
  Administered 2022-12-02 (×3): 1 mg via INTRAVENOUS

## 2022-12-02 MED ORDER — VERAPAMIL HCL 2.5 MG/ML IV SOLN
INTRAVENOUS | Status: DC | PRN
  Administered 2022-12-02: 10 mL via INTRA_ARTERIAL

## 2022-12-02 MED ORDER — HEPARIN SODIUM (PORCINE) 1000 UNIT/ML IJ SOLN
INTRAMUSCULAR | Status: DC | PRN
  Administered 2022-12-02 (×3): 2000 [IU] via INTRAVENOUS
  Administered 2022-12-02: 5000 [IU] via INTRAVENOUS

## 2022-12-02 MED ORDER — HEPARIN (PORCINE) IN NACL 1000-0.9 UT/500ML-% IV SOLN
INTRAVENOUS | Status: DC | PRN
  Administered 2022-12-02 (×2): 500 mL

## 2022-12-02 MED ORDER — LIDOCAINE HCL (PF) 1 % IJ SOLN
INTRAMUSCULAR | Status: AC
  Filled 2022-12-02: qty 30

## 2022-12-02 MED ORDER — VERAPAMIL HCL 2.5 MG/ML IV SOLN
INTRAVENOUS | Status: AC
  Filled 2022-12-02: qty 2

## 2022-12-02 MED ORDER — ADENOSINE 12 MG/4ML IV SOLN
INTRAVENOUS | Status: AC
  Filled 2022-12-02: qty 12

## 2022-12-02 MED ORDER — SODIUM CHLORIDE 0.9 % WEIGHT BASED INFUSION
3.0000 mL/kg/h | INTRAVENOUS | Status: AC
  Administered 2022-12-02: 3 mL/kg/h via INTRAVENOUS

## 2022-12-02 MED ORDER — LABETALOL HCL 5 MG/ML IV SOLN: 10.0000 mg | INTRAVENOUS | Status: DC | PRN

## 2022-12-02 MED ORDER — LIDOCAINE HCL (PF) 1 % IJ SOLN
INTRAMUSCULAR | Status: DC | PRN
  Administered 2022-12-02: 2 mL via INTRADERMAL

## 2022-12-02 MED ORDER — SODIUM CHLORIDE 0.9% FLUSH: 3.0000 mL | Freq: Two times a day (BID) | INTRAVENOUS | Status: DC

## 2022-12-02 MED ORDER — SODIUM CHLORIDE 0.9 % IV SOLN: 250.0000 mL | INTRAVENOUS | Status: DC | PRN

## 2022-12-02 MED ORDER — FENTANYL CITRATE (PF) 100 MCG/2ML IJ SOLN
INTRAMUSCULAR | Status: AC
  Filled 2022-12-02: qty 2

## 2022-12-02 MED ORDER — SODIUM CHLORIDE 0.9% FLUSH: 3.0000 mL | INTRAVENOUS | Status: DC | PRN

## 2022-12-02 MED ORDER — SODIUM CHLORIDE 0.9 % IV SOLN: INTRAVENOUS | Status: DC

## 2022-12-02 MED ORDER — ACETAMINOPHEN 325 MG PO TABS: 650.0000 mg | ORAL_TABLET | ORAL | Status: DC | PRN

## 2022-12-02 MED ORDER — ONDANSETRON HCL 4 MG/2ML IJ SOLN: 4.0000 mg | Freq: Four times a day (QID) | INTRAMUSCULAR | Status: DC | PRN

## 2022-12-02 MED ORDER — SODIUM CHLORIDE 0.9 % IV SOLN
INTRAVENOUS | Status: DC | PRN
  Administered 2022-12-02: 250 mL via INTRAVENOUS

## 2022-12-02 SURGICAL SUPPLY — 25 items
CATH DIAG 6FR PIGTAIL ANGLED (CATHETERS) IMPLANT
CATH INFINITI 5 FR JL3.5 (CATHETERS) IMPLANT
CATH LAUNCHER 5F JR4 (CATHETERS) IMPLANT
CATH LAUNCHER 6FR 3DRIGHT (CATHETERS) IMPLANT
CATH LAUNCHER 6FR AL1 (CATHETERS) IMPLANT
CATH LAUNCHER 6FR EBU3.5 (CATHETERS) IMPLANT
CATH LAUNCHER 6FR JR4 (CATHETERS) IMPLANT
CATH OPTICROSS HD (CATHETERS) IMPLANT
CATH OPTITORQUE TIG 4.0 6F (CATHETERS) IMPLANT
CATH TELESCOPE 6F GEC (CATHETERS) IMPLANT
CATHETER LAUNCHER 6FR 3DRIGHT (CATHETERS) ×1
CATHETER LAUNCHER 6FR AL1 (CATHETERS) ×1
DEVICE RAD COMP TR BAND LRG (VASCULAR PRODUCTS) IMPLANT
GLIDESHEATH SLEND SS 6F .021 (SHEATH) IMPLANT
GUIDEWIRE PRESSURE X 175 (WIRE) IMPLANT
KIT ESSENTIALS PG (KITS) IMPLANT
KIT HEART LEFT (KITS) ×1 IMPLANT
PACK CARDIAC CATHETERIZATION (CUSTOM PROCEDURE TRAY) ×1 IMPLANT
PROTECTION STATION PRESSURIZED (MISCELLANEOUS) ×1
SHEATH 6FR 75 DEST SLENDER (SHEATH) IMPLANT
SLED PULL BACK IVUS (MISCELLANEOUS) IMPLANT
STATION PROTECTION PRESSURIZED (MISCELLANEOUS) IMPLANT
TRANSDUCER W/STOPCOCK (MISCELLANEOUS) ×1 IMPLANT
TUBING CIL FLEX 10 FLL-RA (TUBING) ×1 IMPLANT
WIRE EMERALD 3MM-J .035X260CM (WIRE) IMPLANT

## 2022-12-02 NOTE — Discharge Instructions (Addendum)
Take isosorbide mononitrate '30mg'$  daily at bedtimeRadial Site Care Drink plenty of fluid for the next 3 days  Keep arm elevated for the next 24 hours The following information offers guidance on how to care for yourself after your procedure. Your health care provider may also give you more specific instructions. If you have problems or questions, contact your health care provider. What can I expect after the procedure? After the procedure, it is common to have bruising and tenderness in the incision area. Follow these instructions at home: Incision site care  Follow instructions from your health care provider about how to take care of your incision site. Make sure you:  Remove your dressing 24 hours after discharge.  Do not take baths, swim, or use a hot tub for 1 week. You may shower 24 hours after the procedure or as told by your health care provider. Remove the dressing and gently wash the incision area with plain soap and water. Pat the area dry with a clean towel. Do not rub the site. That could cause bleeding. Do not apply powder or lotion to the site. Check your incision site every day for signs of infection. Check for: Redness, swelling, or pain. Fluid or blood. Warmth. Pus or a bad smell. Activity For 24 hours after the procedure, or as directed by your health care provider: Do not flex or bend the affected arm. Do not push or pull heavy objects with the affected arm. Do not operate machinery or power tools. Do not drive. You should not drive yourself home from the hospital or clinic if you go home during that time period. You may drive 24 hours after the procedure unless your health care provider tells you not to. Do not lift anything that is heavier than 10 lb (4.5 kg), or the limit that you are told, until your health care provider says that it is safe. Return to your normal activities as told by your health care provider. Ask your health care provider what activities are  safe for you and when you can return to work. If you were given a sedative during the procedure, it can affect you for several hours. Do not drive or operate machinery until your health care provider says that it is safe. General instructions Take over-the-counter and prescription medicines only as told by your health care provider. If you will be going home right after the procedure, plan to have a responsible adult care for you for the time you are told. This is important. Keep all follow-up visits. This is important. Contact a health care provider if: You have a fever or chills. You have any of these signs of infection at your incision site: Redness, swelling, or pain. Fluid or blood. Warmth. Pus or a bad smell. Get help right away if: The incision area swells very fast. The incision area is bleeding, and the bleeding does not stop when you hold steady pressure on the area. Your arm or hand becomes pale, cool, tingly, or numb. These symptoms may represent a serious problem that is an emergency. Do not wait to see if the symptoms will go away. Get medical help right away. Call your local emergency services (911 in the [place] Do not drive yourself to the hospital. Summary After the procedure, it is common to have bruising and tenderness at the incision site. Follow instructions from your health care provider about how to take care of your radial site incision. Check the incision every day for signs of infection.  Do not lift anything that is heavier than 10 lb (4.5 kg), or the limit that you are told, until your health care provider says that it is safe. Get help right away if the incision area swells very fast, you have bleeding at the incision site that will not stop, or your arm or hand becomes pale, cool, or numb. This information is not intended to replace advice given to you by your health care provider. Make sure you discuss any questions you have with your health care  provider. Document Revised: 12/08/2020 Document Reviewed: 12/08/2020 Elsevier Patient Education  [employer].

## 2022-12-02 NOTE — Interval H&P Note (Signed)
History and Physical Interval Note:  12/02/2022 11:37 AM  Ladell Pier  has presented today for surgery, with the diagnosis of chest pain.  The various methods of treatment have been discussed with the patient and family. After consideration of risks, benefits and other options for treatment, the patient has consented to  Procedure(s): LEFT HEART CATH AND CORONARY ANGIOGRAPHY (N/A) as a surgical intervention.  The patient's history has been reviewed, patient examined, no change in status, stable for surgery.  I have reviewed the patient's chart and labs.  Questions were answered to the patient's satisfaction.    Cath Lab Visit (complete for each Cath Lab visit)  Clinical Evaluation Leading to the Procedure:   ACS: No.  Non-ACS:    Anginal Classification: CCS II  Anti-ischemic medical therapy: Maximal Therapy (2 or more classes of medications)  Non-Invasive Test Results: Intermediate-risk stress test findings: cardiac mortality 1-3%/year  Prior CABG: No previous CABG        Early [place]

## 2022-12-02 NOTE — Progress Notes (Signed)
TR BAND REMOVAL  LOCATION:    right radial  DEFLATED PER PROTOCOL:    Yes.    TIME BAND OFF / DRESSING APPLIED:    1625 gauze dressing applied   SITE UPON ARRIVAL:    Level 0  SITE AFTER BAND REMOVAL:    Level 0  CIRCULATION SENSATION AND MOVEMENT:    Within Normal Limits   Yes.    COMMENTS:   no issues noted

## 2022-12-03 ENCOUNTER — Encounter (HOSPITAL_COMMUNITY): Payer: Self-pay | Admitting: Internal Medicine

## 2022-12-15 DIAGNOSIS — R7301 Impaired fasting glucose: Secondary | ICD-10-CM | POA: Diagnosis not present

## 2022-12-15 DIAGNOSIS — D649 Anemia, unspecified: Secondary | ICD-10-CM | POA: Diagnosis not present

## 2022-12-15 DIAGNOSIS — E119 Type 2 diabetes mellitus without complications: Secondary | ICD-10-CM | POA: Diagnosis not present

## 2022-12-15 DIAGNOSIS — I10 Essential (primary) hypertension: Secondary | ICD-10-CM | POA: Diagnosis not present

## 2022-12-15 DIAGNOSIS — E7849 Other hyperlipidemia: Secondary | ICD-10-CM | POA: Diagnosis not present

## 2022-12-22 DIAGNOSIS — R7303 Prediabetes: Secondary | ICD-10-CM | POA: Diagnosis not present

## 2022-12-22 DIAGNOSIS — R7301 Impaired fasting glucose: Secondary | ICD-10-CM | POA: Diagnosis not present

## 2022-12-22 DIAGNOSIS — I10 Essential (primary) hypertension: Secondary | ICD-10-CM | POA: Diagnosis not present

## 2022-12-22 DIAGNOSIS — F17210 Nicotine dependence, cigarettes, uncomplicated: Secondary | ICD-10-CM | POA: Diagnosis not present

## 2022-12-22 DIAGNOSIS — R69 Illness, unspecified: Secondary | ICD-10-CM | POA: Diagnosis not present

## 2022-12-22 DIAGNOSIS — E7849 Other hyperlipidemia: Secondary | ICD-10-CM | POA: Diagnosis not present

## 2022-12-22 DIAGNOSIS — Z6826 Body mass index (BMI) 26.0-26.9, adult: Secondary | ICD-10-CM | POA: Diagnosis not present

## 2022-12-22 DIAGNOSIS — I25118 Atherosclerotic heart disease of native coronary artery with other forms of angina pectoris: Secondary | ICD-10-CM | POA: Diagnosis not present

## 2022-12-22 DIAGNOSIS — F419 Anxiety disorder, unspecified: Secondary | ICD-10-CM | POA: Diagnosis not present

## 2022-12-23 ENCOUNTER — Encounter (HOSPITAL_COMMUNITY): Payer: Self-pay

## 2022-12-23 ENCOUNTER — Ambulatory Visit (HOSPITAL_COMMUNITY)
Admission: RE | Admit: 2022-12-23 | Discharge: 2022-12-23 | Disposition: A | Discharge status: Home / Self Care | Payer: Medicare HMO | Source: Ambulatory Visit | Attending: General Practice | Admitting: General Practice

## 2022-12-23 DIAGNOSIS — Z1231 Encounter for screening mammogram for malignant neoplasm of breast: Secondary | ICD-10-CM | POA: Diagnosis not present

## 2022-12-28 ENCOUNTER — Encounter: Payer: Self-pay | Admitting: *Deleted

## 2022-12-28 ENCOUNTER — Ambulatory Visit: Payer: Medicare HMO | Attending: Internal Medicine | Admitting: Internal Medicine

## 2022-12-28 ENCOUNTER — Encounter: Payer: Self-pay | Admitting: Internal Medicine

## 2022-12-28 VITALS — BP 148/80 | HR 66 | Ht 61.0 in | Wt 135.8 lb

## 2022-12-28 DIAGNOSIS — I201 Angina pectoris with documented spasm: Secondary | ICD-10-CM | POA: Diagnosis not present

## 2022-12-28 MED ORDER — ISOSORBIDE MONONITRATE ER 30 MG PO TB24: 30.0000 mg | ORAL_TABLET | Freq: Every day | ORAL | 3 refills | Status: DC
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
## 2022-12-28 NOTE — Patient Instructions (Addendum)

## 2023-01-26 DIAGNOSIS — H04123 Dry eye syndrome of bilateral lacrimal glands: Secondary | ICD-10-CM | POA: Diagnosis not present

## 2023-02-01 DIAGNOSIS — Z6826 Body mass index (BMI) 26.0-26.9, adult: Secondary | ICD-10-CM | POA: Diagnosis not present

## 2023-02-01 DIAGNOSIS — R030 Elevated blood-pressure reading, without diagnosis of hypertension: Secondary | ICD-10-CM | POA: Diagnosis not present

## 2023-02-01 DIAGNOSIS — R197 Diarrhea, unspecified: Secondary | ICD-10-CM | POA: Diagnosis not present

## 2023-02-01 DIAGNOSIS — R69 Illness, unspecified: Secondary | ICD-10-CM | POA: Diagnosis not present

## 2023-02-01 DIAGNOSIS — R1110 Vomiting, unspecified: Secondary | ICD-10-CM | POA: Diagnosis not present

## 2023-02-20 DIAGNOSIS — R109 Unspecified abdominal pain: Secondary | ICD-10-CM | POA: Diagnosis not present

## 2023-02-20 DIAGNOSIS — R030 Elevated blood-pressure reading, without diagnosis of hypertension: Secondary | ICD-10-CM | POA: Diagnosis not present

## 2023-02-20 DIAGNOSIS — Z6826 Body mass index (BMI) 26.0-26.9, adult: Secondary | ICD-10-CM | POA: Diagnosis not present

## 2023-02-20 DIAGNOSIS — R197 Diarrhea, unspecified: Secondary | ICD-10-CM | POA: Diagnosis not present

## 2023-02-20 DIAGNOSIS — R69 Illness, unspecified: Secondary | ICD-10-CM | POA: Diagnosis not present

## 2023-02-23 DIAGNOSIS — R197 Diarrhea, unspecified: Secondary | ICD-10-CM | POA: Diagnosis not present
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
## 2023-03-08 ENCOUNTER — Ambulatory Visit: Payer: Medicare HMO | Admitting: Internal Medicine

## 2023-03-08 ENCOUNTER — Encounter: Payer: Self-pay | Admitting: Gastroenterology

## 2023-03-08 ENCOUNTER — Ambulatory Visit: Payer: Medicare HMO | Admitting: Gastroenterology

## 2023-03-08 VITALS — BP 140/62 | HR 75 | Temp 98.5°F | Ht 61.0 in | Wt 140.6 lb

## 2023-03-08 DIAGNOSIS — K529 Noninfective gastroenteritis and colitis, unspecified: Secondary | ICD-10-CM | POA: Insufficient documentation

## 2023-03-08 DIAGNOSIS — R109 Unspecified abdominal pain: Secondary | ICD-10-CM | POA: Diagnosis not present

## 2023-03-08 DIAGNOSIS — R197 Diarrhea, unspecified: Secondary | ICD-10-CM | POA: Diagnosis not present

## 2023-03-08 DIAGNOSIS — R1110 Vomiting, unspecified: Secondary | ICD-10-CM | POA: Diagnosis not present

## 2023-03-08 MED ORDER — DICYCLOMINE HCL 10 MG PO CAPS: 10.0000 mg | ORAL_CAPSULE | Freq: Three times a day (TID) | ORAL | 0 refills | Status: DC

## 2023-03-08 NOTE — Progress Notes (Deleted)
Friday before east, pizza. Food poinsoingg.  Had IBS, better since 06/2022, since mom passed, stress less.  Before easter.  BM daily on good days. Sometimes little more with IBS.  Foul smelling, fishy Mucous Cramping abdominal pain. Better after BM Noisy stomach.  No melena, brbpr.  Tingling llq.  BM 5-10 per day. Nocturnal. Vomiting twice last weekend, at beach. Some nasuea.   Tan/pink oval balls.   Cdiff pcr neg Blood work: celiac, tsh Stool for parasites   No antibiotics. Imodium 2-3 per day.  No heartburn issues lately.

## 2023-03-08 NOTE — Patient Instructions (Addendum)
We will request records for review.  Start bentyl (dicyclomine) 10mg  before meals and at bedtime for cramps/diarrhea. Start probiotic once daily, these are bought over the counter. Ermalene Searing, [employer] are all good options.

## 2023-03-11 ENCOUNTER — Telehealth: Payer: Self-pay | Admitting: Gastroenterology

## 2023-03-11 NOTE — Telephone Encounter (Signed)
Darl Pikes, we requested records from PCP and [facility] regarding stools from two weeks ago. I received Cdiff but I did not receive the stool culture. She has since had more labs/stools.  Please request results: -stool culture -Ova and parasites -TSH -Celiac labs

## 2023-03-12 DIAGNOSIS — R030 Elevated blood-pressure reading, without diagnosis of hypertension: Secondary | ICD-10-CM | POA: Diagnosis not present

## 2023-03-12 DIAGNOSIS — Z6826 Body mass index (BMI) 26.0-26.9, adult: Secondary | ICD-10-CM | POA: Diagnosis not present

## 2023-03-12 DIAGNOSIS — J209 Acute bronchitis, unspecified: Secondary | ICD-10-CM | POA: Diagnosis not present

## 2023-03-12 DIAGNOSIS — F17210 Nicotine dependence, cigarettes, uncomplicated: Secondary | ICD-10-CM | POA: Diagnosis not present

## 2023-03-12 DIAGNOSIS — Z20828 Contact with and (suspected) exposure to other viral communicable diseases: Secondary | ICD-10-CM | POA: Diagnosis not present

## 2023-03-12 DIAGNOSIS — R059 Cough, unspecified: Secondary | ICD-10-CM | POA: Diagnosis not present

## 2023-03-16 ENCOUNTER — Telehealth: Payer: Self-pay

## 2023-03-16 NOTE — Telephone Encounter (Signed)
Outside labs are in your box for review.

## 2023-03-22 NOTE — Telephone Encounter (Addendum)
Received additional records from [facility].  O+P negative. TTG IgA neg, IgA normal. Alph-gal IgE equivocal with level of 0.15. TSH normal  I was unable to obtain other records, she thought she had stool culture done as well. No additional attempts need to be made  -->at this time please find out how she is doing as far as diarrhea -->given her equivocal alpha-gal level, I would recommend checking whole Alpha-Gal panel.

## 2023-03-22 NOTE — Telephone Encounter (Signed)
Pt has an appt with you tomorrow. 

## 2023-03-22 NOTE — Telephone Encounter (Signed)
See other telephone note.  

## 2023-03-22 NOTE — Progress Notes (Addendum)
GI Office Note    Referring Provider: Joeseph Amor Primary Care Physician:  Joeseph Amor  Primary Gastroenterologist: Dr. Levon Hedger  Chief Complaint   Chief Complaint  Patient presents with   Follow-up    Follow up. Still having diarrhea, anywhere from 8 to 10 times a day and it is watery.     History of Present Illness   Barbara Phillips is a 67 y.o. female presenting today for follow up. H/o GERD, IBS. Last seen 03/08/23. Had screening colonoscopy in 10/2022 as outlined below.   Patient started having diarrhea after Easter. Her and husband both had symptoms after eating pizza at [employer]. Her husband was ill for a couple of days. She had ongoing loose stools 5-10 times per day, foul smelling initially, associated with abdominal cramping. No melena, brbpr. No recent abx. History of "indigestion" and chest pain improved with Imdur and metoprolol for left main coronary spams.   Work up included negative O&P, TTG IgA negative, IgA normal, TSH normal, alpha gal IgE equivocal with level of 0.15.  C. difficile PCR negative.  Bentyl helped cramps but not diarrhea. Kaopectate, imodium not helping.  Takes Imodium 1-2 in the mornings only.  Diarrhea worse in the mornings.  Some nocturnal stools.  Having 8-10 stools per day.  This morning she has had 5 watery stools already.  Cannot remember the last time she had a solid stool.  Prior to Easter, stools less frequent, not as loose.  Consumes red meat, milk, ice cream.  No melena or rectal bleeding.  Weight is down a few pounds.      Colonoscopy 10/2022: -one 8 mm polyp in the transverse colon, sessile serrated adenoma -Nonbleeding internal hemorrhoids -Next colonoscopy 5 years   EGD 2015: Small sliding hiatal hernia without evidence of erosive esophagitis ring or stricture formation. Single and erosion with focal erythema. These changes are possibly secondary to aspirin or NSAID use. Esophagus dilated by passing 54  French Maloney dilator but no mucosal disruption induced.     Medications   Current Outpatient Medications  Medication Sig Dispense Refill   acetaminophen (TYLENOL) 500 MG tablet Take 500 mg by mouth every 6 (six) hours as needed (pain.).     aspirin EC 81 MG tablet Take 1 tablet (81 mg total) by mouth daily. Swallow whole. 90 tablet 3   Cholecalciferol (VITAMIN D3 SUPER STRENGTH) 50 MCG (2000 UT) TABS Take 2,000 Units by mouth in the morning.     Coenzyme Q10-Vitamin E (QUNOL ULTRA COQ10) 100-150 MG-UNIT CAPS Take 1 capsule by mouth in the morning.     dicyclomine (BENTYL) 10 MG capsule Take 1 capsule (10 mg total) by mouth 4 (four) times daily -  before meals and at bedtime. As needed for cramps/diarrhea 90 capsule 0   fluticasone (FLONASE) 50 MCG/ACT nasal spray Place 1 spray into both nostrils daily as needed for allergies.     isosorbide mononitrate (IMDUR) 30 MG 24 hr tablet Take 1 tablet (30 mg total) by mouth daily. 90 tablet 3   metoprolol tartrate (LOPRESSOR) 25 MG tablet Take 1 tablet (25 mg total) by mouth 2 (two) times daily. 180 tablet 3   naproxen sodium (ALEVE) 220 MG tablet Take 220 mg by mouth daily as needed (pain.).     nitroGLYCERIN (NITROSTAT) 0.4 MG SL tablet Place 1 tablet (0.4 mg total) under the tongue every 5 (five) minutes x 3 doses as needed for chest pain (if no  relief after 3rd dose, proceed to ED or call 911). 25 tablet 1   Omega-3 Fatty Acids (FISH OIL) 1000 MG CAPS Take 1,000 mg by mouth in the morning.     pantoprazole (PROTONIX) 40 MG tablet Take 1 tablet (40 mg total) by mouth daily. 90 tablet 3   Polyethyl Glycol-Propyl Glycol (SYSTANE ULTRA OP) Place 1 drop into both eyes daily as needed (dry/irritated eyes.).     Probiotic Product (PROBIOTIC PO) Take 1 capsule by mouth in the morning.     RESTASIS 0.05 % ophthalmic emulsion Place 1 drop into both eyes in the morning.     rosuvastatin (CRESTOR) 20 MG tablet Take 1 tablet (20 mg total) by mouth daily. 90  tablet 3   sertraline (ZOLOFT) 50 MG tablet Take 50 mg by mouth in the morning. Taking every other day. Monday, Wednesday, Friday     triamcinolone cream (KENALOG) 0.1 % Apply 1 Application topically daily as needed (rash/irritation.).     VENTOLIN HFA 108 (90 Base) MCG/ACT inhaler Inhale 2 puffs into the lungs every 6 (six) hours as needed for wheezing or shortness of breath.     No current facility-administered medications for this visit.    Allergies   Allergies as of 03/23/2023 - Review Complete 03/23/2023  Allergen Reaction Noted   Lexapro [escitalopram] Diarrhea    Lisinopril Cough        Review of Systems   General: Negative for anorexia,  fever, chills, fatigue, weakness. +weight loss see hpi ENT: Negative for hoarseness, difficulty swallowing , nasal congestion. CV: Negative for chest pain, angina, palpitations, dyspnea on exertion, peripheral edema.  Respiratory: Negative for dyspnea at rest, dyspnea on exertion, cough, sputum, wheezing.  GI: See history of present illness. GU:  Negative for dysuria, hematuria, urinary incontinence, urinary frequency, nocturnal urination.  Endo: Negative for unusual weight change.     Physical Exam   BP 139/84 (BP Location: Right Arm, Patient Position: Sitting, Cuff Size: Normal)   Pulse 86   Temp 97.8 F (36.6 C) (Temporal)   Ht 5\' 1"  (1.549 m)   Wt 131 lb 12.8 oz (59.8 kg)   SpO2 98%   BMI 24.90 kg/m    General: Well-nourished, well-developed in no acute distress.  Eyes: No icterus. Mouth: Oropharyngeal mucosa moist and pink    Abdomen: Bowel sounds are normal,   nondistended, no hepatosplenomegaly or masses,  no abdominal bruits or hernia , no rebound or guarding. Mild lower abd tenderness Rectal: not performed  Extremities: No lower extremity edema. No clubbing or deformities. Neuro: Alert and oriented x 4   Skin: Warm and dry, no jaundice.   Psych: Alert and cooperative, normal mood and affect.  Labs   See  hpi Imaging Studies   No results found.  Assessment   . Diarrhea: Baseline IBS symptoms significantly worsened since April as outlined above.  Diarrhea tends to be worse in the mornings.  Dicyclomine helping with cramping but not with loose stools.  Will check for EPI, repeat C. difficile testing, GI pathogen panel, CRP, sed rate, alpha gal.  If studies unremarkable, may need to have screening for carcinoid.  If workup remains negative, she may need to have repeat colonoscopy with random colon biopsies to rule out microscopic colitis.  PLAN   Pancreatic elastase, C. difficile GDH, GI pathogen panel, CBC, lipase, CMET, sed rate, CRP, alpha gal.   Increase dicyclomine to 20 mg in the morning, 10 mg before lunch, supper, at bedtime.  If workup remains neg, consider 5HIAA.  Leanna Battles. Melvyn Neth, MHS, PA-C [facility]  I have reviewed the note and agree with the APP's assessment as described in this progress note  If negative workup, may consider a trial of Xifaxan and 5-HIAA.  Would hold off on repeat scopes for now, but may require colonoscopy with random biopsies if all workup is negative.  Katrinka Blazing, MD Gastroenterology and Hepatology [facility]

## 2023-03-23 ENCOUNTER — Encounter: Payer: Self-pay | Admitting: Gastroenterology

## 2023-03-23 ENCOUNTER — Ambulatory Visit: Payer: Medicare HMO | Admitting: Gastroenterology

## 2023-03-23 VITALS — BP 139/84 | HR 86 | Temp 97.8°F | Ht 61.0 in | Wt 131.8 lb

## 2023-03-23 DIAGNOSIS — A09 Infectious gastroenteritis and colitis, unspecified: Secondary | ICD-10-CM

## 2023-03-23 NOTE — Patient Instructions (Signed)
Please complete stools and labs.  You can increase bentyl (dicyclomine) to 20mg  before breakfast. Continue 10mg  before lunch, supper, and at bedtime.

## 2023-03-24 DIAGNOSIS — A09 Infectious gastroenteritis and colitis, unspecified: Secondary | ICD-10-CM | POA: Diagnosis not present

## 2023-04-01 LAB — C. DIFFICILE GDH AND TOXIN A/B
GDH ANTIGEN: NOT DETECTED
MICRO NUMBER:: 14993618
SPECIMEN QUALITY:: ADEQUATE
TOXIN A AND B: NOT DETECTED

## 2023-04-01 LAB — PANCREATIC ELASTASE, FECAL: Pancreatic Elastase-1, Stool: >500 mcg/g

## 2023-04-01 LAB — GASTROINTESTINAL PATHOGEN PNL
CampyloBacter Group: NOT DETECTED
Norovirus GI/GII: NOT DETECTED
Rotavirus A: NOT DETECTED
Salmonella species: NOT DETECTED
Shiga Toxin 1: NOT DETECTED
Shiga Toxin 2: NOT DETECTED
Shigella Species: NOT DETECTED
Vibrio Group: NOT DETECTED
Yersinia enterocolitica: NOT DETECTED

## 2023-04-04 LAB — CBC WITH DIFFERENTIAL/PLATELET
Absolute Monocytes: 738 cells/uL (ref 200–950)
Basophils Absolute: 36 cells/uL (ref 0–200)
Basophils Relative: 0.4 %
Eosinophils Absolute: 63 cells/uL (ref 15–500)
Eosinophils Relative: 0.7 %
HCT: 43.6 % (ref 35.0–45.0)
Hemoglobin: 14.9 g/dL (ref 11.7–15.5)
Lymphs Abs: 2898 cells/uL (ref 850–3900)
MCH: 34.7 pg — ABNORMAL HIGH (ref 27.0–33.0)
MCHC: 34.2 g/dL (ref 32.0–36.0)
MCV: 101.4 fL — ABNORMAL HIGH (ref 80.0–100.0)
MPV: 10.9 fL (ref 7.5–12.5)
Monocytes Relative: 8.2 %
Neutro Abs: 5265 cells/uL (ref 1500–7800)
Neutrophils Relative %: 58.5 %
Platelets: 318 10*3/uL (ref 140–400)
RBC: 4.3 10*6/uL (ref 3.80–5.10)
RDW: 12.7 % (ref 11.0–15.0)
Total Lymphocyte: 32.2 %
WBC: 9 10*3/uL (ref 3.8–10.8)

## 2023-04-04 LAB — COMPREHENSIVE METABOLIC PANEL
AG Ratio: 1.7 (calc) (ref 1.0–2.5)
ALT: 23 U/L (ref 6–29)
AST: 23 U/L (ref 10–35)
Albumin: 4.8 g/dL (ref 3.6–5.1)
Alkaline phosphatase (APISO): 86 U/L (ref 37–153)
BUN: 13 mg/dL (ref 7–25)
CO2: 24 mmol/L (ref 20–32)
Calcium: 10 mg/dL (ref 8.6–10.4)
Chloride: 104 mmol/L (ref 98–110)
Creat: 0.72 mg/dL (ref 0.50–1.05)
Globulin: 2.8 g/dL (calc) (ref 1.9–3.7)
Glucose, Bld: 104 mg/dL (ref 65–139)
Potassium: 4.7 mmol/L (ref 3.5–5.3)
Sodium: 140 mmol/L (ref 135–146)
Total Bilirubin: 0.4 mg/dL (ref 0.2–1.2)
Total Protein: 7.6 g/dL (ref 6.1–8.1)

## 2023-04-04 LAB — ALPHA-GAL PANEL
Allergen, Mutton, f88: <0.1 kU/L
Allergen, Pork, f26: <0.1 kU/L
Beef: <0.1 kU/L
CLASS: 0
CLASS: 0
Class: 0
GALACTOSE-ALPHA-1,3-GALACTOSE IGE*: 0.12 kU/L — ABNORMAL HIGH (ref <0.10)

## 2023-04-04 LAB — C-REACTIVE PROTEIN: CRP: <3 mg/L (ref <8.0)

## 2023-04-04 LAB — LIPASE: Lipase: 51 U/L (ref 7–60)

## 2023-04-04 LAB — SEDIMENTATION RATE: Sed Rate: 17 mm/h (ref 0–30)

## 2023-04-04 LAB — INTERPRETATION:

## 2023-04-05 ENCOUNTER — Other Ambulatory Visit: Payer: Self-pay

## 2023-04-05 DIAGNOSIS — R197 Diarrhea, unspecified: Secondary | ICD-10-CM

## 2023-04-05 DIAGNOSIS — R718 Other abnormality of red blood cells: Secondary | ICD-10-CM

## 2023-04-06 DIAGNOSIS — R718 Other abnormality of red blood cells: Secondary | ICD-10-CM | POA: Diagnosis not present

## 2023-04-07 LAB — B12 AND FOLATE PANEL
Folate: 22.2 ng/mL
Vitamin B-12: 258 pg/mL (ref 200–1100)

## 2023-04-08 ENCOUNTER — Telehealth: Payer: Self-pay

## 2023-04-08 DIAGNOSIS — R718 Other abnormality of red blood cells: Secondary | ICD-10-CM | POA: Diagnosis not present

## 2023-04-08 DIAGNOSIS — R197 Diarrhea, unspecified: Secondary | ICD-10-CM | POA: Diagnosis not present

## 2023-04-08 MED ORDER — DICYCLOMINE HCL 10 MG PO CAPS: 10.0000 mg | ORAL_CAPSULE | Freq: Three times a day (TID) | ORAL | 2 refills | Status: DC

## 2023-04-08 NOTE — Telephone Encounter (Signed)
Pt was made aware.  

## 2023-04-08 NOTE — Addendum Note (Signed)
Addended by: Tiffany Kocher on: 04/08/2023 12:47 PM   Modules accepted: Orders

## 2023-04-08 NOTE — Telephone Encounter (Signed)
Pt states that she has finished the rx of the dicyclomine and states that her stools are starting to want to form instead of being straight water. Pt is wanting to know if you want to call in a refill.

## 2023-04-08 NOTE — Telephone Encounter (Signed)
Rx sent 

## 2023-04-14 LAB — 5 HIAA, QUANTITATIVE, URINE, 24 HOUR
5 HIAA, 24 Hour Urine: 2 mg/24 h (ref <=6.0)
Total Volume: 1500 mL

## 2023-04-16 DIAGNOSIS — E7849 Other hyperlipidemia: Secondary | ICD-10-CM | POA: Diagnosis not present

## 2023-04-16 DIAGNOSIS — E119 Type 2 diabetes mellitus without complications: Secondary | ICD-10-CM | POA: Diagnosis not present

## 2023-04-16 DIAGNOSIS — E7801 Familial hypercholesterolemia: Secondary | ICD-10-CM | POA: Diagnosis not present

## 2023-04-16 DIAGNOSIS — I10 Essential (primary) hypertension: Secondary | ICD-10-CM | POA: Diagnosis not present

## 2023-04-20 DIAGNOSIS — J439 Emphysema, unspecified: Secondary | ICD-10-CM | POA: Diagnosis not present

## 2023-04-20 DIAGNOSIS — E7849 Other hyperlipidemia: Secondary | ICD-10-CM | POA: Diagnosis not present

## 2023-04-20 DIAGNOSIS — F17210 Nicotine dependence, cigarettes, uncomplicated: Secondary | ICD-10-CM | POA: Diagnosis not present

## 2023-04-20 DIAGNOSIS — I10 Essential (primary) hypertension: Secondary | ICD-10-CM | POA: Diagnosis not present

## 2023-04-20 DIAGNOSIS — I700 Atherosclerosis of aorta: Secondary | ICD-10-CM | POA: Diagnosis not present

## 2023-04-20 DIAGNOSIS — I25118 Atherosclerotic heart disease of native coronary artery with other forms of angina pectoris: Secondary | ICD-10-CM | POA: Diagnosis not present

## 2023-04-20 DIAGNOSIS — R7303 Prediabetes: Secondary | ICD-10-CM | POA: Diagnosis not present

## 2023-04-20 DIAGNOSIS — F419 Anxiety disorder, unspecified: Secondary | ICD-10-CM | POA: Diagnosis not present

## 2023-04-20 DIAGNOSIS — R7301 Impaired fasting glucose: Secondary | ICD-10-CM | POA: Diagnosis not present

## 2023-04-20 DIAGNOSIS — Z6826 Body mass index (BMI) 26.0-26.9, adult: Secondary | ICD-10-CM | POA: Diagnosis not present

## 2023-06-11 ENCOUNTER — Ambulatory Visit: Payer: Medicare HMO | Admitting: Gastroenterology

## 2023-06-18 ENCOUNTER — Ambulatory Visit: Payer: Medicare HMO | Admitting: Gastroenterology

## 2023-06-22 ENCOUNTER — Ambulatory Visit: Payer: Medicare HMO | Admitting: Gastroenterology

## 2023-06-27 ENCOUNTER — Other Ambulatory Visit: Payer: Self-pay | Admitting: Gastroenterology

## 2023-06-29 ENCOUNTER — Ambulatory Visit: Payer: Medicare HMO | Attending: Internal Medicine | Admitting: Internal Medicine

## 2023-06-29 ENCOUNTER — Encounter: Payer: Self-pay | Admitting: Internal Medicine

## 2023-06-29 VITALS — BP 118/70 | HR 71 | Ht 61.0 in | Wt 130.4 lb

## 2023-06-29 DIAGNOSIS — I700 Atherosclerosis of aorta: Secondary | ICD-10-CM | POA: Diagnosis not present

## 2023-06-29 DIAGNOSIS — Z0001 Encounter for general adult medical examination with abnormal findings: Secondary | ICD-10-CM | POA: Diagnosis not present

## 2023-06-29 DIAGNOSIS — Z7984 Long term (current) use of oral hypoglycemic drugs: Secondary | ICD-10-CM

## 2023-06-29 DIAGNOSIS — I10 Essential (primary) hypertension: Secondary | ICD-10-CM | POA: Diagnosis not present

## 2023-06-29 DIAGNOSIS — J439 Emphysema, unspecified: Secondary | ICD-10-CM | POA: Diagnosis not present

## 2023-06-29 DIAGNOSIS — Z6824 Body mass index (BMI) 24.0-24.9, adult: Secondary | ICD-10-CM | POA: Diagnosis not present

## 2023-06-29 DIAGNOSIS — D649 Anemia, unspecified: Secondary | ICD-10-CM | POA: Diagnosis not present

## 2023-06-29 DIAGNOSIS — E7849 Other hyperlipidemia: Secondary | ICD-10-CM | POA: Diagnosis not present

## 2023-06-29 DIAGNOSIS — E119 Type 2 diabetes mellitus without complications: Secondary | ICD-10-CM | POA: Insufficient documentation
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
## 2023-06-29 NOTE — Patient Instructions (Signed)

## 2023-07-06 NOTE — Progress Notes (Addendum)
GI Office Note    Referring Provider: Joeseph Amor Primary Care Physician:  Joeseph Amor  Primary Gastroenterologist: Katrinka Blazing, MD   Chief Complaint   Chief Complaint  Patient presents with   Follow-up    Was having watery stools, now having around 4 loose stools in the mornings. Has cut her protonix to 20 mg a day and her zoloft to 25 mg a day to see if that helps, states that it has. Also has started taking metamucil twice daily.     History of Present Illness   Barbara Phillips is a 67 y.o. female presenting today for follow up. Last seen in 03/2023. H/o GERD, IBS.   At last ov, she had noted diarrhea starting after Easter, eating pizza at [employer]. Her husband was ill for a couple of days. She had ongoing loose stools 5-10 times per day, foul smelling initially, associated with abdominal cramping. No melena, brbpr.  Work up included negative O&P, TTG IgA negative, IgA normal, TSH normal, alpha gal IgE equivocal with level of 0.15.  C. difficile PCR negative.   She found that bentyl helped cramps but not diarrhea. Kaopectate, imodium did not help.   Additional labs/stools 04/2023: GI path panel and Cdiff GDH both negative. Sed rate, CRP normal. CMET, lipase all unremarkable. No anemia, MCV was elevated at 101.4. Pancreatic elastase normal. B12/folate normal. 5HIAA normal.  Today: overall doing better. Having 3-4 stools every morning. She Quit fish oil. Was on Zoloft 50mg  for couple of years. Now 25mg  daily. Feels like stools improved after cutting back on her Zoloft and her pantoprazole (now doing 20mg  daily). Adding Metamucil rounded teaspoon twice per day, stools not watery now, but still loose. She is taking only one dicyclomine daily. Recently diagnosed with type 2 DM. Has been eating more clean. She noted having abdominal cramping after eating Smart [employer] ice cream. Ate fatty meal recently while on vacation and had N/V. Overall reflux well controlled on  pantoprazole 20mg  daily. No dysphagia. No melena, brbpr.  Colonoscopy 10/2022: -one 8 mm polyp in the transverse colon, sessile serrated adenoma -Nonbleeding internal hemorrhoids -Next colonoscopy 5 years   EGD 2015: Small sliding hiatal hernia without evidence of erosive esophagitis ring or stricture formation. Single and erosion with focal erythema. These changes are possibly secondary to aspirin or NSAID use. Esophagus dilated by passing 54 French Maloney dilator but no mucosal disruption induced.  Medications   Current Outpatient Medications  Medication Sig Dispense Refill   acetaminophen (TYLENOL) 500 MG tablet Take 500 mg by mouth every 6 (six) hours as needed (pain.).     aspirin EC 81 MG tablet Take 1 tablet (81 mg total) by mouth daily. Swallow whole. 90 tablet 3   Cholecalciferol (VITAMIN D3 SUPER STRENGTH) 50 MCG (2000 UT) TABS Take 2,000 Units by mouth in the morning.     Coenzyme Q10-Vitamin E (QUNOL ULTRA COQ10) 100-150 MG-UNIT CAPS Take 1 capsule by mouth in the morning.     dicyclomine (BENTYL) 10 MG capsule TAKE 1 CAPSULE BY MOUTH 4 TIMES DAILY BEFORE MEAL(S) AND AT BEDTIME AS NEEDED FOR  CRAMPS/DIARRHEA 120 capsule 0   fluticasone (FLONASE) 50 MCG/ACT nasal spray Place 1 spray into both nostrils daily as needed for allergies.     glipiZIDE (GLUCOTROL XL) 2.5 MG 24 hr tablet Take 2.5 mg by mouth daily with breakfast.     isosorbide mononitrate (IMDUR) 30 MG 24 hr tablet Take 1 tablet (  30 mg total) by mouth daily. 90 tablet 3   METAMUCIL FIBER PO Take 5.69 g by mouth 2 (two) times daily. 1 rounded teaspoon daily     metoprolol tartrate (LOPRESSOR) 25 MG tablet Take 1 tablet (25 mg total) by mouth 2 (two) times daily. 180 tablet 3   naproxen sodium (ALEVE) 220 MG tablet Take 220 mg by mouth daily as needed (pain.).     nitroGLYCERIN (NITROSTAT) 0.4 MG SL tablet Place 1 tablet (0.4 mg total) under the tongue every 5 (five) minutes x 3 doses as needed for chest pain (if no  relief after 3rd dose, proceed to ED or call 911). 25 tablet 1   pantoprazole (PROTONIX) 40 MG tablet Take 1 tablet (40 mg total) by mouth daily. (Patient taking differently: Take 20 mg by mouth daily.) 90 tablet 3   Polyethyl Glycol-Propyl Glycol (SYSTANE ULTRA OP) Place 1 drop into both eyes daily as needed (dry/irritated eyes.).     Probiotic Product (PROBIOTIC PO) Take 1 capsule by mouth in the morning.     RESTASIS 0.05 % ophthalmic emulsion Place 1 drop into both eyes in the morning.     rosuvastatin (CRESTOR) 20 MG tablet Take 1 tablet (20 mg total) by mouth daily. 90 tablet 3   sertraline (ZOLOFT) 50 MG tablet Take 25 mg by mouth in the morning. Taking every other day. Monday, Wednesday, Friday     triamcinolone cream (KENALOG) 0.1 % Apply 1 Application topically daily as needed (rash/irritation.).     VENTOLIN HFA 108 (90 Base) MCG/ACT inhaler Inhale 2 puffs into the lungs every 6 (six) hours as needed for wheezing or shortness of breath.     No current facility-administered medications for this visit.    Allergies   Allergies as of 07/07/2023 - Review Complete 07/07/2023  Allergen Reaction Noted   Lexapro [escitalopram] Diarrhea    Lisinopril Cough       Review of Systems   General: Negative for anorexia, weight loss, fever, chills, fatigue, weakness. ENT: Negative for hoarseness, difficulty swallowing , nasal congestion. CV: Negative for chest pain, angina, palpitations, dyspnea on exertion, peripheral edema.  Respiratory: Negative for dyspnea at rest, dyspnea on exertion, cough, sputum, wheezing.  GI: See history of present illness. GU:  Negative for dysuria, hematuria, urinary incontinence, urinary frequency, nocturnal urination.  Endo: Negative for unusual weight change.     Physical Exam   BP 130/80 (BP Location: Right Arm, Patient Position: Sitting, Cuff Size: Normal)   Pulse 73   Temp 98.2 F (36.8 C) (Oral)   Ht 5' (1.524 m)   Wt 132 lb (59.9 kg)   SpO2 98%    BMI 25.78 kg/m    General: Well-nourished, well-developed in no acute distress.  Eyes: No icterus. Mouth: Oropharyngeal mucosa moist and pink  Abdomen: Bowel sounds are normal, nontender, nondistended, no hepatosplenomegaly or masses,  no abdominal bruits or hernia , no rebound or guarding.  Rectal: not performed Extremities: No lower extremity edema. No clubbing or deformities. Neuro: Alert and oriented x 4   Skin: Warm and dry, no jaundice.   Psych: Alert and cooperative, normal mood and affect.  Labs   Lab Results  Component Value Date   NA 140 03/23/2023   CL 104 03/23/2023   K 4.7 03/23/2023   CO2 24 03/23/2023   BUN 13 03/23/2023   CREATININE 0.72 03/23/2023   GFRNONAA >60 01/29/2021   CALCIUM 10.0 03/23/2023   GLUCOSE 104 03/23/2023   Lab Results  Component Value Date   ALT 23 03/23/2023   AST 23 03/23/2023   BILITOT 0.4 03/23/2023   Lab Results  Component Value Date   WBC 9.0 03/23/2023   HGB 14.9 03/23/2023   HCT 43.6 03/23/2023   MCV 101.4 (H) 03/23/2023   PLT 318 03/23/2023   Lab Results  Component Value Date   VITAMINB12 258 04/06/2023   Lab Results  Component Value Date   FOLATE 22.2 04/06/2023   No results found for: "IRON", "TIBC", "FERRITIN" Lab Results  Component Value Date   LIPASE 51 03/23/2023    Imaging Studies   No results found.  Assessment   *GERD *Diarrhea/IBS  Reflux well controlled, tolerated reduced dose of PPI.   Suspected flare of IBS, possible due to infectious process back in the spring. Extensive work up as outlined above was negative. Overall stool consistency and frequency much improved. Now with only morning stools. May find benefit from increasing her dicyclomine from one daily to at least two daily. Continue fiber regimen as this seems to be helping with stool consistency. Discussed alarm symptoms. She will keep [place] posted if symptoms do not improve or worsen.   PLAN   Continue pantoprazole 20 mg daily for  reflux. Increase dicyclomine to at least twice daily.  Continue first dose before breakfast.  May take second dose later in the morning or even try at bedtime to see if this will help her early morning symptoms. Continue Metamucil. Avoid food triggers. She will let us know if she develops any alarm symptoms, worsening symptoms.  Otherwise we will plan to see her back in 1 year for routine follow-up.   Leanna Battles. Melvyn Neth, MHS, PA-C [facility]   I have reviewed the note and agree with the APP's assessment as described in this progress note  Katrinka Blazing, MD Gastroenterology and Hepatology [facility]

## 2023-07-07 ENCOUNTER — Encounter: Payer: Self-pay | Admitting: Gastroenterology

## 2023-07-07 ENCOUNTER — Ambulatory Visit (INDEPENDENT_AMBULATORY_CARE_PROVIDER_SITE_OTHER): Payer: Medicare HMO | Admitting: Gastroenterology

## 2023-07-07 VITALS — BP 130/80 | HR 73 | Temp 98.2°F | Ht 60.0 in | Wt 132.0 lb

## 2023-07-07 DIAGNOSIS — R197 Diarrhea, unspecified: Secondary | ICD-10-CM | POA: Diagnosis not present

## 2023-07-07 DIAGNOSIS — K219 Gastro-esophageal reflux disease without esophagitis: Secondary | ICD-10-CM | POA: Diagnosis not present

## 2023-07-07 NOTE — Patient Instructions (Signed)
Continue pantoprazole 20mg  daily for GERD. Increase your dicyclomine to at least twice a day. Take first one in the morning before breakfast. You can take the second one later in the morning to see if you can cut down in number of stools or even try in the evenings. Find what works best for you. You can take up to four capsules a day, no more than 2 at one time. Continue metamucil. Avoid food triggers. Please let me know if your symptoms do not improve. We will see you back in one year for routine follow. Call sooner if any questions or concerns.

## 2023-07-19 DIAGNOSIS — I10 Essential (primary) hypertension: Secondary | ICD-10-CM | POA: Diagnosis not present

## 2023-07-19 DIAGNOSIS — E7801 Familial hypercholesterolemia: Secondary | ICD-10-CM | POA: Diagnosis not present

## 2023-07-19 DIAGNOSIS — E7849 Other hyperlipidemia: Secondary | ICD-10-CM | POA: Diagnosis not present

## 2023-07-19 DIAGNOSIS — R7301 Impaired fasting glucose: Secondary | ICD-10-CM | POA: Diagnosis not present

## 2023-07-19 DIAGNOSIS — R55 Syncope and collapse: Secondary | ICD-10-CM | POA: Diagnosis not present

## 2023-08-04 DIAGNOSIS — M810 Age-related osteoporosis without current pathological fracture: Secondary | ICD-10-CM | POA: Diagnosis not present

## 2023-09-08 ENCOUNTER — Other Ambulatory Visit (INDEPENDENT_AMBULATORY_CARE_PROVIDER_SITE_OTHER): Payer: Self-pay | Admitting: Gastroenterology

## 2023-09-08 DIAGNOSIS — K219 Gastro-esophageal reflux disease without esophagitis: Secondary | ICD-10-CM

## 2023-09-08 NOTE — Telephone Encounter (Signed)
I called and left a message asked that the patient please return call to the office. 

## 2023-09-08 NOTE — Telephone Encounter (Signed)
I spoke with the patient and she says she is doing well on 20 mg's to please send in 20 mg tablets for her please. Thanks

## 2023-10-09 ENCOUNTER — Other Ambulatory Visit: Payer: Self-pay | Admitting: Internal Medicine

## 2023-10-15 DIAGNOSIS — H9319 Tinnitus, unspecified ear: Secondary | ICD-10-CM | POA: Diagnosis not present

## 2023-10-15 DIAGNOSIS — R519 Headache, unspecified: Secondary | ICD-10-CM | POA: Diagnosis not present

## 2023-10-15 DIAGNOSIS — H9203 Otalgia, bilateral: Secondary | ICD-10-CM | POA: Diagnosis not present

## 2023-10-15 DIAGNOSIS — Z6824 Body mass index (BMI) 24.0-24.9, adult: Secondary | ICD-10-CM | POA: Diagnosis not present

## 2023-10-15 DIAGNOSIS — F17210 Nicotine dependence, cigarettes, uncomplicated: Secondary | ICD-10-CM | POA: Diagnosis not present

## 2023-10-15 DIAGNOSIS — R030 Elevated blood-pressure reading, without diagnosis of hypertension: Secondary | ICD-10-CM | POA: Diagnosis not present

## 2023-11-10 ENCOUNTER — Ambulatory Visit: Payer: Medicare HMO | Admitting: Adult Health

## 2023-11-15 ENCOUNTER — Other Ambulatory Visit (HOSPITAL_COMMUNITY): Payer: Self-pay | Admitting: General Practice

## 2023-11-15 DIAGNOSIS — Z1231 Encounter for screening mammogram for malignant neoplasm of breast: Secondary | ICD-10-CM

## 2023-12-02 DIAGNOSIS — S20212A Contusion of left front wall of thorax, initial encounter: Secondary | ICD-10-CM | POA: Diagnosis not present

## 2023-12-02 DIAGNOSIS — Z6824 Body mass index (BMI) 24.0-24.9, adult: Secondary | ICD-10-CM | POA: Diagnosis not present

## 2023-12-06 ENCOUNTER — Ambulatory Visit: Payer: Medicare HMO | Admitting: Adult Health

## 2023-12-07 ENCOUNTER — Other Ambulatory Visit: Payer: Self-pay | Admitting: Gastroenterology

## 2023-12-09 ENCOUNTER — Ambulatory Visit: Payer: Medicare HMO | Attending: Internal Medicine | Admitting: Internal Medicine

## 2023-12-09 ENCOUNTER — Encounter: Payer: Self-pay | Admitting: Internal Medicine

## 2023-12-09 VITALS — BP 148/76 | HR 64 | Ht 59.0 in | Wt 132.8 lb

## 2023-12-09 DIAGNOSIS — Z720 Tobacco use: Secondary | ICD-10-CM | POA: Diagnosis not present

## 2023-12-09 DIAGNOSIS — I25110 Atherosclerotic heart disease of native coronary artery with unstable angina pectoris: Secondary | ICD-10-CM | POA: Diagnosis not present

## 2023-12-09 DIAGNOSIS — I10 Essential (primary) hypertension: Secondary | ICD-10-CM
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
## 2023-12-09 NOTE — Patient Instructions (Addendum)

## 2023-12-14 ENCOUNTER — Ambulatory Visit: Payer: Medicare HMO | Admitting: Adult Health

## 2023-12-14 ENCOUNTER — Encounter: Payer: Self-pay | Admitting: Adult Health

## 2023-12-14 VITALS — BP 174/83 | HR 71 | Ht 59.0 in | Wt 130.0 lb

## 2023-12-14 DIAGNOSIS — Z1211 Encounter for screening for malignant neoplasm of colon: Secondary | ICD-10-CM

## 2023-12-14 DIAGNOSIS — I10 Essential (primary) hypertension: Secondary | ICD-10-CM

## 2023-12-14 DIAGNOSIS — Z1331 Encounter for screening for depression: Secondary | ICD-10-CM

## 2023-12-14 DIAGNOSIS — Z01419 Encounter for gynecological examination (general) (routine) without abnormal findings: Secondary | ICD-10-CM

## 2023-12-14 LAB — HEMOCCULT GUIAC POC 1CARD (OFFICE): Fecal Occult Blood, POC: NEGATIVE

## 2023-12-14 NOTE — Progress Notes (Signed)
Patient ID: Barbara Phillips, female   DOB: April 12, 1956, 68 y.o.   MRN: [id number] History of Present Illness: Barbara Phillips is a 68 year old white female, married, PM in for a well woman gyn exam. She is going to be great grandma soon, and it is a boy.  Has seen cardiologist recently and is good for a year.     Component Value Date/Time   DIAGPAP  10/16/2022 1000    - Negative for intraepithelial lesion or malignancy (NILM)   DIAGPAP  10/04/2019 1212    - Negative for intraepithelial lesion or malignancy (NILM)   HPVHIGH Negative 10/16/2022 1000   HPVHIGH Negative 10/04/2019 1212   ADEQPAP  10/16/2022 1000    Satisfactory for evaluation; transformation zone component ABSENT.   ADEQPAP  10/04/2019 1212    Satisfactory for evaluation; transformation zone component PRESENT.   PCP is Roxine Caddy PA.   Current Medications, Allergies, Past Medical History, Past Surgical History, Family History and Social History were reviewed in [employer] record.     Review of Systems: Patient denies any headaches, hearing loss, fatigue, blurred vision, shortness of breath, chest pain, abdominal pain, problems with bowel movements, urination, or intercourse.(Not having sex). No joint pain or mood swings.  Denies any vaginal bleeding  Has pain in rectum at times and uses preparation H and sits on hard surface, and hold butt cheeks tight and it passes in about 10-15 minutes  Physical Exam:BP (!) 174/83 (BP Location: Left Arm, Patient Position: Sitting, Cuff Size: Normal)   Pulse 71   Ht 4\' 11"  (1.499 m)   Wt 130 lb (59 kg)   BMI 26.26 kg/m   General:  Well developed, well nourished, no acute distress Skin:  Warm and dry Neck:  Midline trachea, normal thyroid, good ROM, no lymphadenopathy,no carotid bruits heard Lungs; Clear to auscultation bilaterally Breast:  No dominant palpable mass, retraction, or nipple discharge, tender left ribs, dog made her fall, has seen PCP and on mobic and  ice Cardiovascular: Regular rate and rhythm Abdomen:  Soft, non tender, no hepatosplenomegaly Pelvic:  External genitalia is normal in appearance, no lesions.  The vagina is pale. Urethra has no lesions or masses. The cervix is smooth.  Uterus is felt to be normal size, shape, and contour.  No adnexal masses or tenderness noted.Bladder is non tender, no masses felt. Rectal: Good sphincter tone, no polyps, or hemorrhoids felt.  Hemoccult negative. Extremities/musculoskeletal:  No swelling or varicosities noted, no clubbing or cyanosis Psych:  No mood changes, alert and cooperative,seems happy AA is 3 Fall risk is moderate    12/14/2023   11:25 AM 10/16/2022    9:43 AM 10/09/2021   10:28 AM  Depression screen PHQ 2/9  Decreased Interest 0 0 0  Down, Depressed, Hopeless 0 0 0  PHQ - 2 Score 0 0 0  Altered sleeping 0 0 0  Tired, decreased energy 0 1 0  Change in appetite 0 0 0  Feeling bad or failure about yourself  0 0 0  Trouble concentrating 0 0 0  Moving slowly or fidgety/restless 0 0 0  Suicidal thoughts 0 0 0  PHQ-9 Score 0 1 0       12/14/2023   11:26 AM 10/16/2022    9:44 AM 10/09/2021   10:28 AM 10/08/2020    9:37 AM  GAD 7 : Generalized Anxiety Score  Nervous, Anxious, on Edge 0 0 0 0  Control/stop worrying 0 0 0 0  Worry too much - different things 0 0 0 0  Trouble relaxing 0 0 0 0  Restless 0 0 0 0  Easily annoyed or irritable 0 0 0 0  Afraid - awful might happen 0 0 0 0  Total GAD 7 Score 0 0 0 0      Upstream - 12/14/23 1122       Pregnancy Intention Screening   Does the patient want to become pregnant in the next year? N/A    Does the patient's partner want to become pregnant in the next year? N/A    Would the patient like to discuss contraceptive options today? N/A      Contraception Wrap Up   Current Method No Method - Other Reason   PM   End Method No Method - Other Reason   PM   Contraception Counseling Provided No            Examination  chaperoned by Malachy Mood LPN  Impression and plan: 1. Encounter for well woman exam with routine gynecological exam (Primary) Physical in 2 years No more paps needed Mammogram was negative 12/23/22, has one scheduled 12/27/23 at [facility] Colonoscopy per GI Labs with PCP   2. Encounter for screening fecal occult blood testing Hemoccult was negative  - POCT occult blood stool  3. Primary hypertension Take meds and follow up with PCP

## 2023-12-27 ENCOUNTER — Ambulatory Visit (HOSPITAL_COMMUNITY)
Admission: RE | Admit: 2023-12-27 | Discharge: 2023-12-27 | Disposition: A | Discharge status: Home / Self Care | Payer: Medicare HMO | Source: Ambulatory Visit | Attending: General Practice | Admitting: General Practice

## 2023-12-27 DIAGNOSIS — Z1231 Encounter for screening mammogram for malignant neoplasm of breast: Secondary | ICD-10-CM | POA: Diagnosis not present

## 2024-01-18 DIAGNOSIS — Z6824 Body mass index (BMI) 24.0-24.9, adult: Secondary | ICD-10-CM | POA: Diagnosis not present

## 2024-01-18 DIAGNOSIS — I10 Essential (primary) hypertension: Secondary | ICD-10-CM | POA: Diagnosis not present

## 2024-01-18 DIAGNOSIS — R059 Cough, unspecified: Secondary | ICD-10-CM | POA: Diagnosis not present

## 2024-01-18 DIAGNOSIS — F411 Generalized anxiety disorder: Secondary | ICD-10-CM | POA: Diagnosis not present

## 2024-01-18 DIAGNOSIS — E782 Mixed hyperlipidemia: Secondary | ICD-10-CM | POA: Diagnosis not present

## 2024-01-27 DIAGNOSIS — E119 Type 2 diabetes mellitus without complications: Secondary | ICD-10-CM | POA: Diagnosis not present

## 2024-02-25 ENCOUNTER — Other Ambulatory Visit: Payer: Self-pay | Admitting: Internal Medicine

## 2024-03-21 ENCOUNTER — Ambulatory Visit: Admitting: Gastroenterology

## 2024-03-21 ENCOUNTER — Encounter: Payer: Self-pay | Admitting: Gastroenterology

## 2024-03-21 VITALS — BP 129/74 | HR 70 | Temp 98.5°F | Ht 60.0 in | Wt 129.8 lb

## 2024-03-21 DIAGNOSIS — R197 Diarrhea, unspecified: Secondary | ICD-10-CM

## 2024-03-21 DIAGNOSIS — A09 Infectious gastroenteritis and colitis, unspecified: Secondary | ICD-10-CM

## 2024-03-21 DIAGNOSIS — R718 Other abnormality of red blood cells: Secondary | ICD-10-CM | POA: Diagnosis not present

## 2024-03-21 NOTE — Patient Instructions (Signed)
 Please avoid dairy right now, it will make your diarrhea worse.  Complete stool test and labs as soon as you can. If no infection noted on stool test, I will send in RX for diarrhea.   Continue to drink plenty of low sugar beverages, water  to maintain hydration.

## 2024-03-21 NOTE — Progress Notes (Addendum)
 GI Office Note    Referring Provider: Lova Rua Primary Care Physician:  Lova Rua  Primary Gastroenterologist: Samantha Cress, MD   Chief Complaint   Chief Complaint  Patient presents with   Diarrhea    Has been having excessive diarrhea for going on 3 weeks now, states that she goes 10 to 15 times a day    History of Present Illness   Barbara Phillips is a 68 y.o. female presenting today for follow up GERD, IBS. Last seen 07/2023. Previous celiac serologies negative, pancreatic elastase normal, 24 hour urinary 5HIAA negative.    Today: was doing much better until last 3 weeks. Was having solid stools after her last OV.  She started having watery diarrhea few weeks back. Started just before recent beach trip. No recent camping, ill contacts, medication changes, antibiotics. Has tried imodium which is not helping. Bentyl  does not seem to help anymore, taking 1 BID. Also tried fiber. Currently having 10-15 stools times per day. Watery stool. Nocturnal stools. Diarrhea with eating and even when drinking water . Diarrhea even if fasts. No bad abdominal pain, some occasional sharp pain in lower quadrants. Some nausea. No vomiting. Having more heartburn. Tick bite before diarrhea started.   Colonoscopy 10/2022: -one 8 mm polyp in the transverse colon, sessile serrated adenoma -Nonbleeding internal hemorrhoids -Next colonoscopy 5 years   EGD 2015: Small sliding hiatal hernia without evidence of erosive esophagitis ring or stricture formation. Single and erosion with focal erythema. These changes are possibly secondary to aspirin  or NSAID use. Esophagus dilated by passing 54 French Maloney dilator but no mucosal disruption induced.    Medications   Current Outpatient Medications  Medication Sig Dispense Refill   acetaminophen  (TYLENOL ) 500 MG tablet Take 500 mg by mouth every 6 (six) hours as needed (pain.).     aspirin  EC 81 MG tablet Take 1 tablet (81 mg total)  by mouth daily. Swallow whole. 90 tablet 3   Cholecalciferol (VITAMIN D3 SUPER STRENGTH) 50 MCG (2000 UT) TABS Take 2,000 Units by mouth in the morning.     Coenzyme Q10-Vitamin E (QUNOL ULTRA COQ10) 100-150 MG-UNIT CAPS Take 1 capsule by mouth in the morning.     dicyclomine  (BENTYL ) 10 MG capsule TAKE 1 CAPSULE BY MOUTH 4 TIMES DAILY BEFORE MEAL(S) AND AT BEDTIME AS NEEDED FOR CRAMPS/DIARRHEA 120 capsule 3   fluticasone (FLONASE) 50 MCG/ACT nasal spray Place 1 spray into both nostrils daily as needed for allergies.     glipiZIDE 2.5 MG TABS Take 1 tablet by mouth daily.     isosorbide  mononitrate (IMDUR ) 30 MG 24 hr tablet Take 1 tablet by mouth once daily 90 tablet 0   metoprolol  tartrate (LOPRESSOR ) 25 MG tablet Take 1 tablet by mouth twice daily 180 tablet 1   naproxen sodium (ALEVE) 220 MG tablet Take 220 mg by mouth daily as needed (pain.).     nitroGLYCERIN  (NITROSTAT ) 0.4 MG SL tablet Place 1 tablet (0.4 mg total) under the tongue every 5 (five) minutes x 3 doses as needed for chest pain (if no relief after 3rd dose, proceed to ED or call 911). 25 tablet 1   pantoprazole  (PROTONIX ) 20 MG tablet Take 1 tablet (20 mg total) by mouth daily before breakfast. 90 tablet 3   Polyethyl Glycol-Propyl Glycol (SYSTANE ULTRA OP) Place 1 drop into both eyes daily as needed (dry/irritated eyes.).     Probiotic Product (PROBIOTIC PO) Take 1 capsule by mouth  in the morning.     RESTASIS 0.05 % ophthalmic emulsion Place 1 drop into both eyes as needed.     rosuvastatin  (CRESTOR ) 20 MG tablet Take 1 tablet by mouth once daily 90 tablet 1   sertraline (ZOLOFT) 50 MG tablet Take 25 mg by mouth at bedtime.     triamcinolone  cream (KENALOG ) 0.1 % Apply 1 Application topically daily as needed (rash/irritation.).     VENTOLIN HFA 108 (90 Base) MCG/ACT inhaler Inhale 2 puffs into the lungs every 6 (six) hours as needed for wheezing or shortness of breath.     No current facility-administered medications for this  visit.    Allergies   Allergies as of 03/21/2024 - Review Complete 03/21/2024  Allergen Reaction Noted   Lexapro [escitalopram] Diarrhea    Lisinopril Cough     Review of Systems   General: Negative for anorexia, weight loss, fever, chills, +fatigue, weakness. ENT: Negative for hoarseness, difficulty swallowing , nasal congestion. CV: Negative for chest pain, angina, palpitations, dyspnea on exertion, peripheral edema.  Respiratory: Negative for dyspnea at rest, dyspnea on exertion, cough, sputum, wheezing.  GI: See history of present illness. GU:  Negative for dysuria, hematuria, urinary incontinence, urinary frequency, nocturnal urination.  Endo: Negative for unusual weight change.     Physical Exam   BP 129/74 (BP Location: Right Arm, Patient Position: Sitting, Cuff Size: Normal)   Pulse 70   Temp 98.5 F (36.9 C) (Oral)   Ht 5' (1.524 m)   Wt 129 lb 12.8 oz (58.9 kg)   SpO2 98%   BMI 25.35 kg/m    General: Well-nourished, well-developed in no acute distress.  Eyes: No icterus. Mouth: Oropharyngeal mucosa moist and pink   Lungs: Clear to auscultation bilaterally.  Heart: Regular rate and rhythm, no murmurs rubs or gallops.  Abdomen: Bowel sounds are normal , nondistended, no hepatosplenomegaly or masses,  no abdominal bruits or hernia , no rebound or guarding. Mild lower abdominal tenderness Rectal: not performed  Extremities: No lower extremity edema. No clubbing or deformities. Neuro: Alert and oriented x 4   Skin: Warm and dry, no jaundice.   Psych: Alert and cooperative, normal mood and affect.  Labs   No recent labs available Imaging Studies   No results found.  Assessment/Plan:   Diarrhea: extensive work up as outlined above. Was doing better until 3 weeks ago and now with 10-15 watery stools daily/nocturnally.  -stool to rule out infection -fecal lactoferin -CBC, CMET, lipase, TSH/free T4 -if stools and labs unremarkable, consider CT A/P with  contrast and Lomotil -notes diarrhea started after tick bite recently, consider doxycycline       Trudie Fuse. Harles Lied, MHS, PA-C [facility]  I have reviewed the note and agree with the APP's assessment as described in this progress note  As she presented diarrhea bout after tick bite, may also want to consider checking alpha gal if persistent symptoms  Samantha Cress, MD Gastroenterology and Hepatology [facility]

## 2024-03-22 LAB — COMPREHENSIVE METABOLIC PANEL WITH GFR
ALT: 23 IU/L (ref 0–32)
AST: 26 IU/L (ref 0–40)
Albumin: 4.7 g/dL (ref 3.9–4.9)
Alkaline Phosphatase: 89 IU/L (ref 44–121)
BUN/Creatinine Ratio: 12 (ref 12–28)
BUN: 9 mg/dL (ref 8–27)
Bilirubin Total: 0.3 mg/dL (ref 0.0–1.2)
CO2: 17 mmol/L — ABNORMAL LOW (ref 20–29)
Calcium: 9.7 mg/dL (ref 8.7–10.3)
Chloride: 106 mmol/L (ref 96–106)
Creatinine, Ser: 0.74 mg/dL (ref 0.57–1.00)
Globulin, Total: 2.6 g/dL (ref 1.5–4.5)
Glucose: 116 mg/dL — ABNORMAL HIGH (ref 70–99)
Potassium: 5 mmol/L (ref 3.5–5.2)
Sodium: 142 mmol/L (ref 134–144)
Total Protein: 7.3 g/dL (ref 6.0–8.5)
eGFR: 89 mL/min/{1.73_m2} (ref >59)

## 2024-03-22 LAB — CBC WITH DIFFERENTIAL/PLATELET
Basophils Absolute: 0 10*3/uL (ref 0.0–0.2)
Basos: 1 %
EOS (ABSOLUTE): 0.1 10*3/uL (ref 0.0–0.4)
Eos: 1 %
Hematocrit: 46.2 % (ref 34.0–46.6)
Hemoglobin: 15 g/dL (ref 11.1–15.9)
Immature Grans (Abs): 0 10*3/uL (ref 0.0–0.1)
Immature Granulocytes: 0 %
Lymphocytes Absolute: 2.8 10*3/uL (ref 0.7–3.1)
Lymphs: 33 %
MCH: 34.6 pg — ABNORMAL HIGH (ref 26.6–33.0)
MCHC: 32.5 g/dL (ref 31.5–35.7)
MCV: 107 fL — ABNORMAL HIGH (ref 79–97)
Monocytes Absolute: 0.7 10*3/uL (ref 0.1–0.9)
Monocytes: 9 %
Neutrophils Absolute: 4.9 10*3/uL (ref 1.4–7.0)
Neutrophils: 56 %
Platelets: 264 10*3/uL (ref 150–450)
RBC: 4.34 x10E6/uL (ref 3.77–5.28)
RDW: 13.2 % (ref 11.7–15.4)
WBC: 8.6 10*3/uL (ref 3.4–10.8)

## 2024-03-22 LAB — SEDIMENTATION RATE: Sed Rate: 29 mm/h (ref 0–40)

## 2024-03-22 LAB — TSH+FREE T4
Free T4: 1.18 ng/dL (ref 0.82–1.77)
TSH: 2.26 u[IU]/mL (ref 0.450–4.500)

## 2024-03-22 LAB — LIPASE: Lipase: 27 U/L (ref 14–72)

## 2024-03-22 LAB — C-REACTIVE PROTEIN: CRP: 5 mg/L (ref 0–10)

## 2024-03-23 ENCOUNTER — Other Ambulatory Visit: Payer: Self-pay

## 2024-03-23 ENCOUNTER — Ambulatory Visit: Payer: Self-pay | Admitting: Gastroenterology

## 2024-03-23 DIAGNOSIS — R718 Other abnormality of red blood cells: Secondary | ICD-10-CM

## 2024-03-23 LAB — GI PROFILE, STOOL, PCR

## 2024-03-23 LAB — FECAL LACTOFERRIN, QUANT: Lactoferrin, Fecal, Quant.: 5 ug/mL (ref 0.00–7.24)

## 2024-03-24 LAB — VITAMIN B12: Vitamin B-12: 327 pg/mL (ref 232–1245)

## 2024-03-24 LAB — SPECIMEN STATUS REPORT

## 2024-03-29 MED ORDER — DOXYCYCLINE HYCLATE 100 MG PO CAPS
100.0000 mg | ORAL_CAPSULE | Freq: Two times a day (BID) | ORAL | 0 refills | Status: AC
Start: 2024-03-29 — End: 2024-04-12

## 2024-03-31 ENCOUNTER — Other Ambulatory Visit: Payer: Self-pay | Admitting: Internal Medicine

## 2024-04-11 ENCOUNTER — Telehealth: Payer: Self-pay

## 2024-04-11 MED ORDER — DIPHENOXYLATE-ATROPINE 2.5-0.025 MG PO TABS: 1.0000 | ORAL_TABLET | Freq: Two times a day (BID) | ORAL | 0 refills | Status: DC | PRN

## 2024-04-11 NOTE — Telephone Encounter (Signed)
 Recommend CT A/P with contrast for severe diarrhea, rule out carcinoid tumor or colitis.   I will send in rx for lomotil to try.  Is she having any blood in stool or abdominal pain. How many times per day is she having a stool.

## 2024-04-11 NOTE — Telephone Encounter (Signed)
 Lmom for pt to return call

## 2024-04-11 NOTE — Telephone Encounter (Signed)
 Pt was made aware and verbalized understanding. Pt states that there is no blood that she can see and that she is having abd pain and excessive gas. Pt states that today she has went 10 to 12 times and that some days it may only be 4 but that last week was bad like today.

## 2024-04-11 NOTE — Addendum Note (Signed)
 Addended by: Lanney Pitts on: 04/11/2024 01:38 PM   Modules accepted: Orders

## 2024-04-11 NOTE — Telephone Encounter (Signed)
 Pt called stating that she is about to complete the course of doxycycline  and is not any better. Pt states that she is still having bad diarrhea.

## 2024-04-12 ENCOUNTER — Other Ambulatory Visit: Payer: Self-pay | Admitting: *Deleted

## 2024-04-12 DIAGNOSIS — R197 Diarrhea, unspecified: Secondary | ICD-10-CM

## 2024-04-12 NOTE — Addendum Note (Signed)
 Addended by: Alvester Johnson on: 04/12/2024 10:48 AM   Modules accepted: Orders

## 2024-04-12 NOTE — Telephone Encounter (Signed)
 [employer] PA for CT abd/pelvis w/contrast: Authorized # [id number] Authorization Valid from/to: 04/12/2024 - 10/09/2024

## 2024-04-12 NOTE — Telephone Encounter (Signed)
 Pt informed CT scheduled for Wednesday 05/10/24 at [facility], arrive at 4:45 pm to check in.

## 2024-04-13 NOTE — Telephone Encounter (Signed)
 Pt was made aware and verbalized understanding.

## 2024-04-13 NOTE — Telephone Encounter (Signed)
 Noted.  She needs to adequately hydrate.  If symptoms worsen, she should call us  back or go to ED for expedited CT.

## 2024-04-21 ENCOUNTER — Emergency Department (HOSPITAL_COMMUNITY)
Admission: EM | Admit: 2024-04-21 | Discharge: 2024-04-21 | Disposition: A | Discharge status: Home / Self Care | Attending: Student | Admitting: Student

## 2024-04-21 ENCOUNTER — Emergency Department (HOSPITAL_COMMUNITY)

## 2024-04-21 ENCOUNTER — Encounter (HOSPITAL_COMMUNITY): Payer: Self-pay

## 2024-04-21 ENCOUNTER — Other Ambulatory Visit: Payer: Self-pay

## 2024-04-21 DIAGNOSIS — E119 Type 2 diabetes mellitus without complications: Secondary | ICD-10-CM | POA: Diagnosis not present

## 2024-04-21 DIAGNOSIS — I2510 Atherosclerotic heart disease of native coronary artery without angina pectoris: Secondary | ICD-10-CM | POA: Insufficient documentation

## 2024-04-21 DIAGNOSIS — Z7984 Long term (current) use of oral hypoglycemic drugs: Secondary | ICD-10-CM | POA: Diagnosis not present

## 2024-04-21 DIAGNOSIS — Z7982 Long term (current) use of aspirin: Secondary | ICD-10-CM | POA: Insufficient documentation

## 2024-04-21 DIAGNOSIS — I10 Essential (primary) hypertension: Secondary | ICD-10-CM | POA: Insufficient documentation

## 2024-04-21 DIAGNOSIS — K625 Hemorrhage of anus and rectum: Secondary | ICD-10-CM | POA: Diagnosis not present

## 2024-04-21 DIAGNOSIS — K529 Noninfective gastroenteritis and colitis, unspecified: Secondary | ICD-10-CM | POA: Diagnosis not present

## 2024-04-21 DIAGNOSIS — R109 Unspecified abdominal pain: Secondary | ICD-10-CM | POA: Diagnosis not present

## 2024-04-21 DIAGNOSIS — K4090 Unilateral inguinal hernia, without obstruction or gangrene, not specified as recurrent: Secondary | ICD-10-CM | POA: Diagnosis not present

## 2024-04-21 DIAGNOSIS — R197 Diarrhea, unspecified: Secondary | ICD-10-CM | POA: Diagnosis not present

## 2024-04-21 DIAGNOSIS — Z79899 Other long term (current) drug therapy: Secondary | ICD-10-CM | POA: Insufficient documentation

## 2024-04-21 LAB — COMPREHENSIVE METABOLIC PANEL WITH GFR
ALT: 21 U/L (ref 0–44)
AST: 25 U/L (ref 15–41)
Albumin: 4 g/dL (ref 3.5–5.0)
Alkaline Phosphatase: 61 U/L (ref 38–126)
Anion gap: 11 (ref 5–15)
BUN: 13 mg/dL (ref 8–23)
CO2: 22 mmol/L (ref 22–32)
Calcium: 9.3 mg/dL (ref 8.9–10.3)
Chloride: 105 mmol/L (ref 98–111)
Creatinine, Ser: 0.68 mg/dL (ref 0.44–1.00)
GFR, Estimated: >60 mL/min (ref >60)
Glucose, Bld: 71 mg/dL (ref 70–99)
Potassium: 4 mmol/L (ref 3.5–5.1)
Sodium: 138 mmol/L (ref 135–145)
Total Bilirubin: 0.6 mg/dL (ref 0.0–1.2)
Total Protein: 7.1 g/dL (ref 6.5–8.1)

## 2024-04-21 LAB — URINALYSIS, ROUTINE W REFLEX MICROSCOPIC
Bilirubin Urine: NEGATIVE
Glucose, UA: NEGATIVE mg/dL
Hgb urine dipstick: NEGATIVE
Ketones, ur: NEGATIVE mg/dL
Leukocytes,Ua: NEGATIVE
Nitrite: NEGATIVE
Protein, ur: NEGATIVE mg/dL
Specific Gravity, Urine: 1.004 — ABNORMAL LOW (ref 1.005–1.030)
pH: 5 (ref 5.0–8.0)

## 2024-04-21 LAB — CBC
HCT: 44.9 % (ref 36.0–46.0)
Hemoglobin: 14.8 g/dL (ref 12.0–15.0)
MCH: 35 pg — ABNORMAL HIGH (ref 26.0–34.0)
MCHC: 33 g/dL (ref 30.0–36.0)
MCV: 106.1 fL — ABNORMAL HIGH (ref 80.0–100.0)
Platelets: 169 10*3/uL (ref 150–400)
RBC: 4.23 MIL/uL (ref 3.87–5.11)
RDW: 13.5 % (ref 11.5–15.5)
WBC: 7 10*3/uL (ref 4.0–10.5)
nRBC: 0 % (ref 0.0–0.2)

## 2024-04-21 LAB — LIPASE, BLOOD: Lipase: 35 U/L (ref 11–51)

## 2024-04-21 MED ORDER — IOHEXOL 300 MG/ML  SOLN
100.0000 mL | Freq: Once | INTRAMUSCULAR | Status: AC | PRN
  Administered 2024-04-21: 100 mL via INTRAVENOUS

## 2024-04-21 MED ORDER — LACTATED RINGERS IV BOLUS
1000.0000 mL | Freq: Once | INTRAVENOUS | Status: AC
  Administered 2024-04-21: 1000 mL via INTRAVENOUS

## 2024-04-21 MED ORDER — MORPHINE SULFATE (PF) 4 MG/ML IV SOLN
4.0000 mg | Freq: Once | INTRAVENOUS | Status: AC
  Administered 2024-04-21: 4 mg via INTRAVENOUS
  Filled 2024-04-21: qty 1

## 2024-04-21 MED ORDER — ONDANSETRON HCL 4 MG/2ML IJ SOLN
4.0000 mg | Freq: Once | INTRAMUSCULAR | Status: AC
  Administered 2024-04-21: 4 mg via INTRAVENOUS
  Filled 2024-04-21: qty 2

## 2024-04-21 NOTE — ED Provider Notes (Signed)
 [facility] DEPARTMENT AT [facility] Provider Note   CSN: [id number] Arrival date & time: 04/21/24  1017     Patient presents with: Abdominal Pain, Nausea, Emesis, and Rectal Bleeding   Barbara Phillips is a 68 y.o. female.  She has PMH of hypertension, CAD, type 2 diabetes. She presents ER today complaining of severe diarrhea ongoing x 2 months.  She has been following up as an outpatient with GI.  She has had lab tests and stool studies that were all normal but she still having persistent diarrhea up to 15 times per day also having intermittent rectal pain.  She was scheduled for outpatient CT for July, but was told if she had worsening pain or started having blood in the stool to come to the ER.    She states her pain is now worse in the left lower quadrant sometimes radiates to the right lower quadrant and into the left flank.  She also noted small amount of bright red blood in her stool on Wednesday and Thursday of this week, but no more blood noted since then.  She reports that generalized weakness and fatigue.  She is not on blood thinners.  She has been on Bentyl , Imodium and Lomotil  without relief.  No fevers or chills, no vomiting.  No chest pain or shortness of breath or other symptoms.    Abdominal Pain Associated symptoms: hematochezia and vomiting   Emesis Associated symptoms: abdominal pain   Rectal Bleeding Associated symptoms: abdominal pain and vomiting        Prior to Admission medications   Medication Sig Start Date End Date Taking? Authorizing Provider  acetaminophen  (TYLENOL ) 500 MG tablet Take 500 mg by mouth every 6 (six) hours as needed (pain.).    [provider]  aspirin  EC 81 MG tablet Take 1 tablet (81 mg total) by mouth daily. Swallow whole. 10/28/22   Mallipeddi, Vishnu P, MD  Cholecalciferol (VITAMIN D3 SUPER STRENGTH) 50 MCG (2000 UT) TABS Take 2,000 Units by mouth in the morning.    [provider]  Coenzyme  Q10-Vitamin E (QUNOL ULTRA COQ10) 100-150 MG-UNIT CAPS Take 1 capsule by mouth in the morning.    [provider]  dicyclomine  (BENTYL ) 10 MG capsule TAKE 1 CAPSULE BY MOUTH 4 TIMES DAILY BEFORE MEAL(S) AND AT BEDTIME AS NEEDED FOR CRAMPS/DIARRHEA 12/07/23   Lanney Pitts, PA-C  diphenoxylate -atropine  (LOMOTIL ) 2.5-0.025 MG tablet Take 1 tablet by mouth 2 (two) times daily as needed for diarrhea or loose stools. 04/11/24   Lanney Pitts, PA-C  fluticasone (FLONASE) 50 MCG/ACT nasal spray Place 1 spray into both nostrils daily as needed for allergies.    [provider]  glipiZIDE 2.5 MG TABS Take 1 tablet by mouth daily. 01/10/24   [provider]  isosorbide  mononitrate (IMDUR ) 30 MG 24 hr tablet Take 1 tablet by mouth once daily 02/25/24   Mallipeddi, Vishnu P, MD  metoprolol  tartrate (LOPRESSOR ) 25 MG tablet Take 1 tablet by mouth twice daily 03/31/24   Mallipeddi, Vishnu P, MD  naproxen sodium (ALEVE) 220 MG tablet Take 220 mg by mouth daily as needed (pain.).    [provider]  nitroGLYCERIN  (NITROSTAT ) 0.4 MG SL tablet Place 1 tablet (0.4 mg total) under the tongue every 5 (five) minutes x 3 doses as needed for chest pain (if no relief after 3rd dose, proceed to ED or call 911). 10/15/22   Mallipeddi, Vishnu P, MD  pantoprazole  (PROTONIX ) 20 MG  tablet Take 1 tablet (20 mg total) by mouth daily before breakfast. 09/08/23   Lanney Pitts, PA-C  Polyethyl Glycol-Propyl Glycol (SYSTANE ULTRA OP) Place 1 drop into both eyes daily as needed (dry/irritated eyes.).    [provider]  Probiotic Product (PROBIOTIC PO) Take 1 capsule by mouth in the morning.    [provider]  RESTASIS 0.05 % ophthalmic emulsion Place 1 drop into both eyes as needed. 10/01/22   [provider]  rosuvastatin  (CRESTOR ) 20 MG tablet Take 1 tablet by mouth once daily 03/31/24   Mallipeddi, Vishnu P, MD  sertraline (ZOLOFT) 50 MG tablet Take 25 mg by mouth at  bedtime.    [provider]  triamcinolone  cream (KENALOG ) 0.1 % Apply 1 Application topically daily as needed (rash/irritation.).    [provider]  VENTOLIN HFA 108 (90 Base) MCG/ACT inhaler Inhale 2 puffs into the lungs every 6 (six) hours as needed for wheezing or shortness of breath. 07/19/20   [provider]    Allergies: Lexapro [escitalopram] and Lisinopril    Review of Systems  Gastrointestinal:  Positive for abdominal pain, hematochezia and vomiting.    Updated Vital Signs BP 136/76 (BP Location: Right Arm)   Pulse 79   Temp 97.9 F (36.6 C) (Oral)   Resp 17   Ht 5' (1.524 m)   Wt 58.5 kg   SpO2 97%   BMI 25.19 kg/m   Physical Exam Vitals and nursing note reviewed.  Constitutional:      General: She is not in acute distress.    Appearance: She is well-developed.  HENT:     Head: Normocephalic and atraumatic.   Eyes:     Conjunctiva/sclera: Conjunctivae normal.    Cardiovascular:     Rate and Rhythm: Normal rate and regular rhythm.     Heart sounds: No murmur heard. Pulmonary:     Effort: Pulmonary effort is normal. No respiratory distress.     Breath sounds: Normal breath sounds.  Abdominal:     Palpations: Abdomen is soft.     Tenderness: There is abdominal tenderness in the suprapubic area and left upper quadrant. There is no right CVA tenderness or left CVA tenderness.  Genitourinary:    Rectum: Normal. Guaiac result negative. No tenderness.     Comments: Rectal exam chaperoned by NT  Musculoskeletal:        General: No swelling.     Cervical back: Neck supple.   Skin:    General: Skin is warm and dry.     Capillary Refill: Capillary refill takes less than 2 seconds.   Neurological:     Mental Status: She is alert.   Psychiatric:        Mood and Affect: Mood normal.     (all labs ordered are listed, but only abnormal results are displayed) Labs Reviewed  LIPASE, BLOOD  COMPREHENSIVE METABOLIC PANEL WITH GFR   CBC  URINALYSIS, ROUTINE W REFLEX MICROSCOPIC    EKG: None  Radiology: No results found.   Procedures   Medications Ordered in the ED - No data to display  Clinical Course as of 04/21/24 1650  Fri Apr 21, 2024  1616 Labs are reassuring, CT is pending, I called the radiology reading room at 3:21 PM and they are going to escalate the images so they already read by radiology as soon as possible. [CB]    Clinical Course User Index [CB] Baxter Limber A, PA-C  Medical Decision Making This patient presents to the ED for concern of left lower quadrant abdominal pain and diarrhea x 3 weeks that is worsening, this involves an extensive number of treatment options, and is a complaint that carries with it a high risk of complications and morbidity.  The differential diagnosis includes colitis, diverticulitis, carcinoid syndrome, inflammatory bowel disease, infectious diarrhea, other   Co morbidities that complicate the patient evaluation  Diabetes, hypertension, heart disease   Additional history obtained:  Additional history obtained from EMR External records from outside source obtained and reviewed including outpatient GI notes, outpatient labs   Lab Tests:  I Ordered, and personally interpreted labs.  The pertinent results include: CBC, CMP, UA and lipase are all normal   Imaging Studies ordered:  I ordered imaging studies including CT abdomen pelvis I independently visualized and interpreted imaging which showed fluid-filled colon suggesting enteritis no other acute findings I agree with the radiologist interpretation     Problem List / ED Course / Critical interventions / Medication management  Diarrhea and abdominal pain-patient has been having this ongoing for several months, worse in the past couple weeks, slowly worse in the past several days.  She is able to tolerate p.o. but states she immediately has diarrhea, up to 15 times  per day.  She came in today because she was having worsened left lower quadrant pain and had some blood in her stool a couple of days ago which has not resolved.  Rectal exam is normal and guaiac is negative at bedside.  She had some mild right lower quadrant and moderate left lower quadrant tenderness on exam with no rebound guarding or rigidity.  Her vitals are normal.  We proceeded with CT.  No significant findings on her CT.  Given the duration and severity of her symptoms I did consult GI and spoke with Dr. Alita Irwin.  He does not feel patient needs to be hospitalized but needs outpatient colonoscopy.  He is going to message the office to have them set up with close follow-up appointment with one of the PAs and they can schedule colonoscopy.  Discussed findings and plan with patient who was agreeable. I ordered medication including morphine  for pain  Reevaluation of the patient after these medicines showed that the patient improved I have reviewed the patients home medicines and have made adjustments as needed    Test / Admission - Considered:  Consideration of need for hospitalization    Amount and/or Complexity of Data Reviewed Labs: ordered. Radiology: ordered.  Risk Prescription drug management.        Final diagnoses:  None    ED Discharge Orders     None          Joshua Nieves 04/21/24 1654    Karlyn Overman, MD 04/21/24 [phone]

## 2024-04-21 NOTE — Discharge Instructions (Signed)
 You are seen in the emergency room today for abdominal pain and diarrhea.  Your blood work was all reassuring and your CT scan did not show any acute findings.  I spoke with Dr. Alita Irwin in the emergency department.  He is going to send a message to the office to get you a close follow-up appointment so they can schedule you for a colonoscopy soon.  Please come back to the ER if you start having worsening pain, vomiting or fevers or any other worrisome changes.

## 2024-04-21 NOTE — ED Triage Notes (Signed)
 Patient come in POV for complaint of Nausea, Diarrhea, cramps and has been passing some blood in her stool bright red. Stated noted the blood on Tuesday and Wednesday, stated food turn to water . Scheduled for CT on July 9th by Gastroenterologist, was told to go to ED if symptoms got worse.

## 2024-04-24 ENCOUNTER — Telehealth: Payer: Self-pay

## 2024-04-24 NOTE — Telephone Encounter (Signed)
 I was reached by Dr. Cinderella about this patient coming to the ER on the weekend. If still presenting persistent symptoms, will need to arrange colonoscopy ASAP.

## 2024-04-24 NOTE — Telephone Encounter (Signed)
 Pt called stating that she was seen recently in the ER for excessive diarrhea and blood in stool. Was told CT that was done in the ER was normal and that she needed to have a colonoscopy. Unable to get pt in until July 16th which is not a good day for the pt due to her husband having a colonoscopy that morning. Pt states that she can not wait that long that she feels weaker by the moment and that she is going more than 16 times a day and that the imodium and bentyl  is not working. Can one of you see her in an urgent slot?

## 2024-04-24 NOTE — Telephone Encounter (Signed)
 I am out remainder of the week. Sonny is off. Agree with Josette.   Please see Dr. Samuel note regarding colonoscopy ASAP.
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
## 2024-04-25 NOTE — Telephone Encounter (Signed)
CT has been cancelled.

## 2024-04-26 ENCOUNTER — Ambulatory Visit: Admitting: Gastroenterology

## 2024-04-26 ENCOUNTER — Encounter: Payer: Self-pay | Admitting: *Deleted

## 2024-04-26 ENCOUNTER — Encounter: Payer: Self-pay | Admitting: Gastroenterology

## 2024-04-26 VITALS — BP 131/74 | HR 84 | Temp 96.9°F | Ht 60.0 in | Wt 130.0 lb

## 2024-04-26 DIAGNOSIS — R109 Unspecified abdominal pain: Secondary | ICD-10-CM

## 2024-04-26 DIAGNOSIS — R143 Flatulence: Secondary | ICD-10-CM

## 2024-04-26 DIAGNOSIS — K625 Hemorrhage of anus and rectum: Secondary | ICD-10-CM

## 2024-04-26 DIAGNOSIS — R197 Diarrhea, unspecified: Secondary | ICD-10-CM | POA: Diagnosis not present

## 2024-04-26 DIAGNOSIS — R1084 Generalized abdominal pain: Secondary | ICD-10-CM

## 2024-04-26 NOTE — Patient Instructions (Addendum)
 WE will get you scheduled for a colonoscopy ASAP.   Have labs and stool testing completed at [facility].   You can resume dicyclomine  10 mg up to 4 times daily.   You can continue Lomotil .   Avoid dairy for now.   We will see you back after your colonoscopy.   Josette Centers, PA-C [facility] Gastroenterology

## 2024-04-27 ENCOUNTER — Encounter: Payer: Self-pay | Admitting: Gastroenterology

## 2024-04-27 LAB — C. DIFFICILE GDH AND TOXIN A/B
GDH ANTIGEN: NOT DETECTED
MICRO NUMBER:: 16627104
SPECIMEN QUALITY:: ADEQUATE
TOXIN A AND B: NOT DETECTED

## 2024-04-28 ENCOUNTER — Encounter (HOSPITAL_COMMUNITY)
Admission: RE | Admit: 2024-04-28 | Discharge: 2024-04-28 | Disposition: A | Discharge status: Home / Self Care | Source: Ambulatory Visit | Attending: Gastroenterology | Admitting: Gastroenterology

## 2024-04-28 ENCOUNTER — Encounter (HOSPITAL_COMMUNITY): Payer: Self-pay

## 2024-04-30 ENCOUNTER — Ambulatory Visit: Payer: Self-pay | Admitting: Gastroenterology

## 2024-04-30 LAB — ALPHA-GAL PANEL
Allergen, Mutton, f88: 0.42 kU/L — ABNORMAL HIGH
Allergen, Pork, f26: 0.41 kU/L — ABNORMAL HIGH
Beef: 1.35 kU/L — ABNORMAL HIGH
CLASS: 1
CLASS: 2
Class: 1
GALACTOSE-ALPHA-1,3-GALACTOSE IGE*: 1.28 kU/L — ABNORMAL HIGH (ref <0.10)

## 2024-04-30 LAB — INTERPRETATION:

## 2024-05-02 ENCOUNTER — Encounter (HOSPITAL_COMMUNITY)
Admission: RE | Disposition: A | Discharge status: Home / Self Care | Payer: Self-pay | Source: Home / Self Care | Attending: Gastroenterology

## 2024-05-02 ENCOUNTER — Ambulatory Visit (HOSPITAL_COMMUNITY): Admitting: Anesthesiology

## 2024-05-02 ENCOUNTER — Ambulatory Visit (HOSPITAL_COMMUNITY)
Admission: RE | Admit: 2024-05-02 | Discharge: 2024-05-02 | Disposition: A | Discharge status: Home / Self Care | Attending: Gastroenterology | Admitting: Gastroenterology

## 2024-05-02 ENCOUNTER — Encounter (HOSPITAL_COMMUNITY): Payer: Self-pay | Admitting: Gastroenterology

## 2024-05-02 ENCOUNTER — Encounter (INDEPENDENT_AMBULATORY_CARE_PROVIDER_SITE_OTHER): Payer: Self-pay | Admitting: *Deleted

## 2024-05-02 DIAGNOSIS — W57XXXA Bitten or stung by nonvenomous insect and other nonvenomous arthropods, initial encounter: Secondary | ICD-10-CM | POA: Insufficient documentation

## 2024-05-02 DIAGNOSIS — M1990 Unspecified osteoarthritis, unspecified site: Secondary | ICD-10-CM | POA: Insufficient documentation

## 2024-05-02 DIAGNOSIS — G709 Myoneural disorder, unspecified: Secondary | ICD-10-CM | POA: Diagnosis not present

## 2024-05-02 DIAGNOSIS — K52832 Lymphocytic colitis: Secondary | ICD-10-CM | POA: Diagnosis not present

## 2024-05-02 DIAGNOSIS — Z7984 Long term (current) use of oral hypoglycemic drugs: Secondary | ICD-10-CM | POA: Insufficient documentation

## 2024-05-02 DIAGNOSIS — K219 Gastro-esophageal reflux disease without esophagitis: Secondary | ICD-10-CM | POA: Insufficient documentation

## 2024-05-02 DIAGNOSIS — E119 Type 2 diabetes mellitus without complications: Secondary | ICD-10-CM | POA: Diagnosis not present

## 2024-05-02 DIAGNOSIS — I10 Essential (primary) hypertension: Secondary | ICD-10-CM | POA: Diagnosis not present

## 2024-05-02 DIAGNOSIS — K648 Other hemorrhoids: Secondary | ICD-10-CM | POA: Diagnosis not present

## 2024-05-02 DIAGNOSIS — I2510 Atherosclerotic heart disease of native coronary artery without angina pectoris: Secondary | ICD-10-CM | POA: Diagnosis not present

## 2024-05-02 DIAGNOSIS — F17200 Nicotine dependence, unspecified, uncomplicated: Secondary | ICD-10-CM | POA: Diagnosis not present

## 2024-05-02 DIAGNOSIS — R197 Diarrhea, unspecified: Secondary | ICD-10-CM | POA: Diagnosis not present

## 2024-05-02 DIAGNOSIS — K529 Noninfective gastroenteritis and colitis, unspecified: Secondary | ICD-10-CM | POA: Diagnosis present

## 2024-05-02 HISTORY — PX: COLONOSCOPY: SHX5424

## 2024-05-02 HISTORY — DX: Lymphocytic colitis: K52.832

## 2024-05-02 LAB — HM COLONOSCOPY

## 2024-05-02 LAB — GLUCOSE, CAPILLARY: Glucose-Capillary: 118 mg/dL — ABNORMAL HIGH (ref 70–99)

## 2024-05-02 SURGERY — COLONOSCOPY
Anesthesia: General

## 2024-05-02 MED ORDER — PROPOFOL 500 MG/50ML IV EMUL
INTRAVENOUS | Status: DC | PRN
  Administered 2024-05-02: 100 mg via INTRAVENOUS
  Administered 2024-05-02: 200 ug/kg/min via INTRAVENOUS

## 2024-05-02 MED ORDER — PHENYLEPHRINE 80 MCG/ML (10ML) SYRINGE FOR IV PUSH (FOR BLOOD PRESSURE SUPPORT)
PREFILLED_SYRINGE | INTRAVENOUS | Status: DC | PRN
  Administered 2024-05-02 (×2): 80 ug via INTRAVENOUS

## 2024-05-02 MED ORDER — LACTATED RINGERS IV SOLN: INTRAVENOUS | Status: DC

## 2024-05-02 NOTE — Discharge Instructions (Signed)
 You are being discharged to home.  Resume your previous diet with STRICT AVOIDANCE OF RED MEAT.  We are waiting for your pathology results.  Your physician has recommended a repeat colonoscopy in five years for screening purposes.  No ibuprofen , naproxen, or other non-steroidal anti-inflammatory drugs.

## 2024-05-02 NOTE — Anesthesia Preprocedure Evaluation (Addendum)
 Anesthesia Evaluation  Patient identified by MRN, date of birth, ID band Patient awake    Reviewed: Allergy & Precautions, H&P , NPO status , Patient's Chart, lab work & pertinent test results  Airway Mallampati: II  TM Distance: >3 FB Neck ROM: Full    Dental no notable dental hx.    Pulmonary Current Smoker   Pulmonary exam normal breath sounds clear to auscultation       Cardiovascular hypertension, + CAD  Normal cardiovascular exam Rhythm:Regular Rate:Normal     Neuro/Psych  PSYCHIATRIC DISORDERS Anxiety      Neuromuscular disease    GI/Hepatic Neg liver ROS,GERD  ,,  Endo/Other  diabetes    Renal/GU negative Renal ROS  negative genitourinary   Musculoskeletal  (+) Arthritis ,    Abdominal   Peds negative pediatric ROS (+)  Hematology negative hematology ROS (+)   Anesthesia Other Findings   Reproductive/Obstetrics negative OB ROS                             Anesthesia Physical Anesthesia Plan  ASA: 3  Anesthesia Plan: General   Post-op Pain Management:    Induction: Intravenous  PONV Risk Score and Plan: Propofol  infusion  Airway Management Planned: Nasal Cannula  Additional Equipment:   Intra-op Plan:   Post-operative Plan:   Informed Consent: I have reviewed the patients History and Physical, chart, labs and discussed the procedure including the risks, benefits and alternatives for the proposed anesthesia with the patient or authorized representative who has indicated his/her understanding and acceptance.     Dental advisory given  Plan Discussed with: CRNA  Anesthesia Plan Comments:        Anesthesia Quick Evaluation

## 2024-05-02 NOTE — Transfer of Care (Signed)
 Immediate Anesthesia Transfer of Care Note  Patient: Barbara Phillips  Procedure(s) Performed: COLONOSCOPY  Patient Location: Short Stay  Anesthesia Type:General  Level of Consciousness: awake, alert , and oriented  Airway & Oxygen Therapy: Patient Spontanous Breathing  Post-op Assessment: Report given to RN and Post -op Vital signs reviewed and stable  Post vital signs: Reviewed and stable  Last Vitals:  Vitals Value Taken Time  BP 94/57 05/02/24 12:39  Temp 36.4 C 05/02/24 12:35  Pulse 84 05/02/24 12:39  Resp 18 05/02/24 12:39  SpO2 100 % 05/02/24 12:39    Last Pain:  Vitals:   05/02/24 1235  TempSrc: Oral  PainSc: 0-No pain      Patients Stated Pain Goal: 5 (05/02/24 1203)  Complications: No notable events documented.

## 2024-05-02 NOTE — Anesthesia Postprocedure Evaluation (Signed)
 Anesthesia Post Note  Patient: Barbara Phillips  Procedure(s) Performed: COLONOSCOPY  Patient location during evaluation: PACU Anesthesia Type: General Level of consciousness: awake and alert Pain management: pain level controlled Vital Signs Assessment: post-procedure vital signs reviewed and stable Respiratory status: spontaneous breathing, nonlabored ventilation, respiratory function stable and patient connected to nasal cannula oxygen Cardiovascular status: stable and blood pressure returned to baseline Postop Assessment: no apparent nausea or vomiting Anesthetic complications: no  No notable events documented.   Last Vitals:  Vitals:   05/02/24 1235 05/02/24 1239  BP: (!) 93/58 (!) 94/57  Pulse: 79 84  Resp: 17 18  Temp: 36.4 C   SpO2: 98% 100%    Last Pain:  Vitals:   05/02/24 1235  TempSrc: Oral  PainSc: 0-No pain                 Andrea Limes

## 2024-05-02 NOTE — Interval H&P Note (Signed)
 History and Physical Interval Note:  05/02/2024 11:49 AM  Barbara Phillips  has presented today for surgery, with the diagnosis of rectal bleeding, diarrhea.  The various methods of treatment have been discussed with the patient and family. After consideration of risks, benefits and other options for treatment, the patient has consented to  Procedure(s) with comments: COLONOSCOPY (N/A) - 2:00 pm, asa 2 as a surgical intervention.  The patient's history has been reviewed, patient examined, no change in status, stable for surgery.  I have reviewed the patient's chart and labs.  Questions were answered to the patient's satisfaction.     Sandy Blouch Castaneda Mayorga

## 2024-05-02 NOTE — Op Note (Signed)
 [facility] Patient Name: Barbara Phillips Procedure Date: 05/02/2024 11:54 AM MRN: [id number] Date of Birth: 1956/02/05 Attending MD: Toribio Fortune , , [id number] CSN: [id number] Age: 68 Admit Type: Outpatient Procedure:                Colonoscopy Indications:              Clinically significant diarrhea of unexplained                            origin Providers:                Toribio Fortune, Rosina Sprague, Devere Lodge, Jon Loge Referring MD:              Medicines:                Monitored Anesthesia Care Complications:            No immediate complications. Estimated Blood Loss:     Estimated blood loss: none. Procedure:                Pre-Anesthesia Assessment:                           - Prior to the procedure, a History and Physical                            was performed, and patient medications, allergies                            and sensitivities were reviewed. The patient's                            tolerance of previous anesthesia was reviewed.                           - The risks and benefits of the procedure and the                            sedation options and risks were discussed with the                            patient. All questions were answered and informed                            consent was obtained.                           - ASA Grade Assessment: III - A patient with severe                            systemic disease.                           After obtaining informed consent, the colonoscope  was passed under direct vision. Throughout the                            procedure, the patient's blood pressure, pulse, and                            oxygen saturations were monitored continuously. The                            [id number]-[id number] ([id number]) scope was introduced through                            the anus and advanced to the the terminal ileum.                            The  colonoscopy was performed without difficulty.                            The patient tolerated the procedure well. The                            quality of the bowel preparation was excellent. Scope In: 12:15:47 PM Scope Out: 12:31:19 PM Scope Withdrawal Time: 0 hours 10 minutes 51 seconds  Total Procedure Duration: 0 hours 15 minutes 32 seconds  Findings:      The perianal and digital rectal examinations were normal.      The terminal ileum appeared normal.      The colon (entire examined portion) appeared normal. Biopsies for       histology were taken with a cold forceps from the right colon and left       colon for evaluation of microscopic colitis.      Non-bleeding internal hemorrhoids were found during retroflexion. The       hemorrhoids were small. Impression:               - The examined portion of the ileum was normal.                           - The entire examined colon is normal. Biopsied.                           - Non-bleeding internal hemorrhoids. Moderate Sedation:      Per Anesthesia Care Recommendation:           - Discharge patient to home (ambulatory).                           - Resume previous diet with STRICT AVOIDANCE OF RED                            MEAT.                           - Await pathology results.                           - Repeat  colonoscopy in 5 years for screening                            purposes.                           - If negative biopsies will schedule give trial of                            colestipol.                           - If persistent symptoms despite dietary                            modifications, will recommend capsule endoscopy.                           - No ibuprofen , naproxen, or other non-steroidal                            anti-inflammatory drugs. Procedure Code(s):        --- Professional ---                           [phone], Colonoscopy, flexible; with biopsy, single                            or  multiple Diagnosis Code(s):        --- Professional ---                           K64.8, Other hemorrhoids                           R19.7, Diarrhea, unspecified CPT copyright 2022 American Medical Association. All rights reserved. The codes documented in this report are preliminary and upon coder review may  be revised to meet current compliance requirements. Toribio Fortune, MD Toribio Fortune,  05/02/2024 12:47:24 PM This report has been signed electronically. Number of Addenda: 0

## 2024-05-03 ENCOUNTER — Ambulatory Visit (INDEPENDENT_AMBULATORY_CARE_PROVIDER_SITE_OTHER): Payer: Self-pay | Admitting: Gastroenterology

## 2024-05-03 ENCOUNTER — Encounter (HOSPITAL_COMMUNITY): Payer: Self-pay | Admitting: Gastroenterology

## 2024-05-03 DIAGNOSIS — K52832 Lymphocytic colitis: Secondary | ICD-10-CM

## 2024-05-03 LAB — SURGICAL PATHOLOGY

## 2024-05-03 MED ORDER — BUDESONIDE 3 MG PO CPEP: ORAL_CAPSULE | ORAL | 0 refills | Status: AC

## 2024-05-03 NOTE — Progress Notes (Signed)
 5 yr TCS noted in recall Patient result letter mailed procedure note and pathology result faxed to PCP

## 2024-05-10 ENCOUNTER — Ambulatory Visit (HOSPITAL_COMMUNITY)

## 2024-05-17 ENCOUNTER — Ambulatory Visit: Admitting: Gastroenterology

## 2024-05-23 ENCOUNTER — Other Ambulatory Visit: Payer: Self-pay | Admitting: Internal Medicine

## 2024-05-23 DIAGNOSIS — E7849 Other hyperlipidemia: Secondary | ICD-10-CM | POA: Diagnosis not present

## 2024-05-23 DIAGNOSIS — Z6823 Body mass index (BMI) 23.0-23.9, adult: Secondary | ICD-10-CM | POA: Diagnosis not present

## 2024-05-23 DIAGNOSIS — F411 Generalized anxiety disorder: Secondary | ICD-10-CM | POA: Diagnosis not present

## 2024-05-23 DIAGNOSIS — R059 Cough, unspecified: Secondary | ICD-10-CM | POA: Diagnosis not present

## 2024-05-23 DIAGNOSIS — I10 Essential (primary) hypertension: Secondary | ICD-10-CM | POA: Diagnosis not present

## 2024-05-23 DIAGNOSIS — E782 Mixed hyperlipidemia: Secondary | ICD-10-CM | POA: Diagnosis not present
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
## 2024-06-12 ENCOUNTER — Encounter: Payer: Self-pay | Admitting: Gastroenterology

## 2024-06-12 ENCOUNTER — Ambulatory Visit: Admitting: Gastroenterology

## 2024-06-12 VITALS — BP 135/67 | HR 85 | Temp 96.9°F | Ht 60.0 in | Wt 127.6 lb

## 2024-06-12 DIAGNOSIS — Z91014 Allergy to mammalian meats: Secondary | ICD-10-CM

## 2024-06-12 DIAGNOSIS — K52832 Lymphocytic colitis: Secondary | ICD-10-CM

## 2024-06-12 DIAGNOSIS — K219 Gastro-esophageal reflux disease without esophagitis: Secondary | ICD-10-CM

## 2024-06-12 DIAGNOSIS — R197 Diarrhea, unspecified: Secondary | ICD-10-CM

## 2024-06-12 MED ORDER — FAMOTIDINE 20 MG PO TABS: 20.0000 mg | ORAL_TABLET | ORAL | 3 refills | Status: DC | PRN

## 2024-06-12 NOTE — Patient Instructions (Addendum)
 Please have stool test completed at [facility].  Continue taking budesonide  9 mg daily.  Once we have stool test results back, if they are negative, you can start Imodium to help control diarrhea.  If diarrhea does not improve, please let us  know and we can try starting cholestyramine.  Stop pantoprazole  for now.  You can take famotidine  20 mg as needed or over-the-counter Tums as needed.  If you start noticing heartburn 3 or more times a week, take famotidine  20 mg daily to prevent heartburn.  Avoid NSAIDs including ibuprofen , Aleve, Advil , BC powders, Goody powders, and anything that says NSAID in the package.  Continue to avoid beef, pork, dairy products due to alpha gal.  Follow-up in 6 weeks or sooner if needed.   Josette Centers, PA-C [facility]

## 2024-06-14 ENCOUNTER — Ambulatory Visit: Payer: Self-pay | Admitting: Gastroenterology

## 2024-06-14 LAB — GI PROFILE, STOOL, PCR

## 2024-06-25 ENCOUNTER — Other Ambulatory Visit: Payer: Self-pay | Admitting: Internal Medicine

## 2024-07-04 DIAGNOSIS — Z6824 Body mass index (BMI) 24.0-24.9, adult: Secondary | ICD-10-CM | POA: Diagnosis not present

## 2024-07-04 DIAGNOSIS — Z0001 Encounter for general adult medical examination with abnormal findings: Secondary | ICD-10-CM | POA: Diagnosis not present

## 2024-07-04 DIAGNOSIS — F411 Generalized anxiety disorder: Secondary | ICD-10-CM | POA: Diagnosis not present

## 2024-07-04 DIAGNOSIS — Z1331 Encounter for screening for depression: Secondary | ICD-10-CM | POA: Diagnosis not present

## 2024-07-04 DIAGNOSIS — Z1389 Encounter for screening for other disorder: Secondary | ICD-10-CM | POA: Diagnosis not present

## 2024-07-04 DIAGNOSIS — E7849 Other hyperlipidemia: Secondary | ICD-10-CM | POA: Diagnosis not present

## 2024-07-04 DIAGNOSIS — Z0000 Encounter for general adult medical examination without abnormal findings: Secondary | ICD-10-CM | POA: Diagnosis not present

## 2024-07-04 DIAGNOSIS — E782 Mixed hyperlipidemia: Secondary | ICD-10-CM | POA: Diagnosis not present

## 2024-07-04 DIAGNOSIS — R059 Cough, unspecified: Secondary | ICD-10-CM | POA: Diagnosis not present

## 2024-07-04 DIAGNOSIS — I10 Essential (primary) hypertension: Secondary | ICD-10-CM | POA: Diagnosis not present

## 2024-07-13 DIAGNOSIS — R051 Acute cough: Secondary | ICD-10-CM | POA: Diagnosis not present

## 2024-07-13 DIAGNOSIS — J0101 Acute recurrent maxillary sinusitis: Secondary | ICD-10-CM | POA: Diagnosis not present

## 2024-07-13 DIAGNOSIS — F17210 Nicotine dependence, cigarettes, uncomplicated: Secondary | ICD-10-CM | POA: Diagnosis not present

## 2024-07-13 DIAGNOSIS — J019 Acute sinusitis, unspecified: Secondary | ICD-10-CM | POA: Diagnosis not present

## 2024-07-13 DIAGNOSIS — Z20828 Contact with and (suspected) exposure to other viral communicable diseases: Secondary | ICD-10-CM | POA: Diagnosis not present

## 2024-07-13 DIAGNOSIS — Z6824 Body mass index (BMI) 24.0-24.9, adult: Secondary | ICD-10-CM | POA: Diagnosis not present

## 2024-07-31 ENCOUNTER — Ambulatory Visit: Admitting: Gastroenterology

## 2024-07-31 ENCOUNTER — Encounter: Payer: Self-pay | Admitting: Gastroenterology

## 2024-07-31 VITALS — BP 132/69 | HR 74 | Temp 98.4°F | Ht 60.0 in | Wt 128.2 lb

## 2024-07-31 DIAGNOSIS — Z91014 Allergy to mammalian meats: Secondary | ICD-10-CM | POA: Diagnosis not present

## 2024-07-31 DIAGNOSIS — K52832 Lymphocytic colitis: Secondary | ICD-10-CM | POA: Diagnosis not present

## 2024-07-31 DIAGNOSIS — T781XXA Other adverse food reactions, not elsewhere classified, initial encounter: Secondary | ICD-10-CM | POA: Insufficient documentation

## 2024-07-31 NOTE — Patient Instructions (Signed)
 Continue budesonide  taper as prescribed. If you have increased stool frequency or begin having loose stools again, please call my CMA Tammy at [phone]. We will make adjustments on your dosage if needed. Continue to avoid red meat and red meat byproducts. As discussed, you need to monitor for symptoms, if you noticed more alarming symptoms to red meat such as itching, rash, swelling etc, then you will need to go to allergist to discuss using Epi-Pen for rescue medication.  We will recheck Alpha Gal in 11/2024.  Return office visit as needed.

## 2024-07-31 NOTE — Progress Notes (Signed)
 GI Office Note    Referring Provider: Jolee Greig VEAR DEVONNA Primary Care Physician:  Jolee Greig VEAR DEVONNA  Primary Gastroenterologist: Toribio Fortune, MD   Chief Complaint   Chief Complaint  Patient presents with   Follow-up    Doing well, no issues    History of Present Illness   Barbara Phillips is a 68 y.o. female presenting today for follow up. History of Alpha Gal and lymphocytic colitis.   Discussed the use of AI scribe software for clinical note transcription with the patient, who gave verbal consent to proceed.   She experiences severe diarrhea, initially up to 20-25 times a day, which was watery and debilitating, leading to significant weakness. This prompted a colonoscopy and biopsy, confirming a diagnosis of lymphocytic colitis. She has been on budesonide , initially at 9 mg daily for two months, and is currently on a tapering dose of 6 mg daily since July 08, 2024. Her symptoms have improved significantly, with bowel movements reduced to 1-3 times daily, primarily in the morning, and she no longer requires additional medications like Imodium. She also finds that discontinuing protonix  and Zoloft also help her diarrhea, in additional to eliminating medications with capsule (due to gelatin) with exception of the budesonide .   She had positive Alpha Gal to beef, pork, and lamb with beef being the most significant. She notes diarrhea improved with elimination of gelatin and red meats. She tolerates dairy products like cheese and butter but experiences diarrhea with ice cream. Her alpha-gal titers were previously negative one year ago, but positive 3 months ago.    She currently uses famotidine  as needed for heartburn, which she finds effective. No blood in stool, abdominal pain, vomiting.     Wt Readings from Last 3 Encounters:  07/31/24 128 lb 3.2 oz (58.2 kg)  06/12/24 127 lb 9.6 oz (57.9 kg)  04/28/24 129 lb (58.5 kg)     Prior Data   06/2024: GI profile  neg  04/2024: Alpha Gal positive with beef 1.35, lamb 0.42, port 0.41, galactose alpha IgE 1.28 (up from one year prior 0.12). Cdiff neg  03/2024: Fecal lactoferrin, CRP, Sed rate all normal. GI profile negative. TSH 2.260  04/2023: 24 hr urinary 5HIAA 2.0, pancreatic elastase >500, celiac serologies negative  CT A/P with contrast 04/2024: IMPRESSION: 1. Fluid throughout much of the colon which is nonspecific but may implicate some degree of enteritis. 2. Right inguinal hernia containing fat. 3. Aortic atherosclerosis.  Colonoscopy 05/02/24: - The examined portion of the ileum was normal. - The entire examined colon is normal. Biopsied. - Non- bleeding internal hemorrhoids. - Path with lymphocytic colitis.  - She was prescribed budesonide .  - Recommended 5 yr surveillance colonoscopy.   Colonoscopy 10/2022: -one 8 mm polyp in the transverse colon, sessile serrated adenoma -Nonbleeding internal hemorrhoids -Next colonoscopy 5 years   EGD 2015: Small sliding hiatal hernia without evidence of erosive esophagitis ring or stricture formation. Single and erosion with focal erythema. These changes are possibly secondary to aspirin  or NSAID use. Esophagus dilated by passing 54 French Maloney dilator but no mucosal disruption induced.    Medications   Current Outpatient Medications  Medication Sig Dispense Refill   acetaminophen  (TYLENOL ) 500 MG tablet Take 500 mg by mouth every 6 (six) hours as needed (pain.).     alendronate (FOSAMAX) 70 MG tablet Take 70 mg by mouth once a week.     aspirin  EC 81 MG tablet Take 1 tablet (81 mg  total) by mouth daily. Swallow whole. 90 tablet 3   budesonide  (ENTOCORT EC ) 3 MG 24 hr capsule Take 3 capsules (9 mg total) by mouth daily for 60 days, THEN 2 capsules (6 mg total) daily for 60 days, THEN 1 capsule (3 mg total) daily. 360 capsule 0   famotidine  (PEPCID ) 20 MG tablet Take 1 tablet (20 mg total) by mouth as needed for heartburn or indigestion. 30  tablet 3   fluticasone (FLONASE) 50 MCG/ACT nasal spray Place 1 spray into both nostrils daily as needed for allergies.     glipiZIDE 2.5 MG TABS Take 1 tablet by mouth daily.     isosorbide  mononitrate (IMDUR ) 30 MG 24 hr tablet Take 1 tablet by mouth once daily 90 tablet 2   metoprolol  tartrate (LOPRESSOR ) 25 MG tablet Take 1 tablet by mouth twice daily 180 tablet 1   nitroGLYCERIN  (NITROSTAT ) 0.4 MG SL tablet Place 1 tablet (0.4 mg total) under the tongue every 5 (five) minutes x 3 doses as needed for chest pain (if no relief after 3rd dose, proceed to ED or call 911). 25 tablet 1   Polyethylene Glycol 400 (BLINK TEARS OP) Apply to eye. (Patient taking differently: Apply to eye daily as needed.)     rosuvastatin  (CRESTOR ) 20 MG tablet Take 1 tablet by mouth once daily 90 tablet 1   triamcinolone  cream (KENALOG ) 0.1 % Apply 1 Application topically daily as needed (rash/irritation.).     VENTOLIN HFA 108 (90 Base) MCG/ACT inhaler Inhale 2 puffs into the lungs every 6 (six) hours as needed for wheezing or shortness of breath.     No current facility-administered medications for this visit.    Allergies   Allergies as of 07/31/2024 - Review Complete 07/31/2024  Allergen Reaction Noted   Alpha-gal  07/31/2024   Lexapro [escitalopram] Diarrhea    Lisinopril Cough        Review of Systems   General: Negative for anorexia, weight loss, fever, chills, fatigue, weakness. ENT: Negative for hoarseness, difficulty swallowing , nasal congestion. CV: Negative for chest pain, angina, palpitations, dyspnea on exertion, peripheral edema.  Respiratory: Negative for dyspnea at rest, dyspnea on exertion, cough, sputum, wheezing.  GI: See history of present illness. GU:  Negative for dysuria, hematuria, urinary incontinence, urinary frequency, nocturnal urination.  Endo: Negative for unusual weight change.     Physical Exam   BP 132/69 (BP Location: Right Arm, Patient Position: Sitting, Cuff Size:  Normal)   Pulse 74   Temp 98.4 F (36.9 C) (Oral)   Ht 5' (1.524 m)   Wt 128 lb 3.2 oz (58.2 kg)   SpO2 95%   BMI 25.04 kg/m    General: Well-nourished, well-developed in no acute distress.  Eyes: No icterus. Mouth: Oropharyngeal mucosa moist and pink   Abdomen: Bowel sounds are normal, nontender, nondistended, no hepatosplenomegaly or masses,  no abdominal bruits or hernia , no rebound or guarding.  Rectal: not performed Extremities: No lower extremity edema. No clubbing or deformities. Neuro: Alert and oriented x 4   Skin: Warm and dry, no jaundice.   Psych: Alert and cooperative, normal mood and affect.  Labs   Lab Results  Component Value Date   ALT 21 04/21/2024   AST 25 04/21/2024   ALKPHOS 61 04/21/2024   BILITOT 0.6 04/21/2024   Lab Results  Component Value Date   WBC 7.0 04/21/2024   HGB 14.8 04/21/2024   HCT 44.9 04/21/2024   MCV 106.1 (H) 04/21/2024  PLT 169 04/21/2024   Lab Results  Component Value Date   NA 138 04/21/2024   CL 105 04/21/2024   K 4.0 04/21/2024   CO2 22 04/21/2024   BUN 13 04/21/2024   CREATININE 0.68 04/21/2024   GFRNONAA >60 04/21/2024   CALCIUM  9.3 04/21/2024   ALBUMIN 4.0 04/21/2024   GLUCOSE 71 04/21/2024   Lab Results  Component Value Date   VITAMINB12 327 03/21/2024   Lab Results  Component Value Date   FOLATE 22.2 04/06/2023    Imaging Studies   No results found.  Assessment/Plan:      Lymphocytic colitis Symptoms improved with budesonide , bowel movements reduced to 1-3 times daily. Protonix  and Zoloft discontinuation may have contributed to improvement. - Continue budesonide  tapering regimen. She will complete six month course.  - Monitor for diarrhea recurrence, we will adjust budesonide  if symptoms return.   Alpha-gal syndrome (mammalian meat allergy) Confirmed with elevated titers, to beef, lamb, and pork but most severe to beef. Symptoms include diarrhea, no anaphylaxis reported. - Avoid beef, pork,  lamb. Would encourage avoiding dairy especially if symptomatic. She has cut out capsule with gelatin and noted improvement in diarrhea as well.  - Monitor alpha-gal titers in January 2026 (patient request). - Consider allergist referral if symptoms worsen and/or include rash, itching, swelling. She is not interested in pursuing referral at this time.    Barbara Phillips, MHS, PA-C [facility]  I have reviewed the note and agree with the APP's assessment as described in this progress note  Toribio Fortune, MD Gastroenterology and Hepatology [facility]

## 2024-08-07 DIAGNOSIS — Z23 Encounter for immunization: Secondary | ICD-10-CM | POA: Diagnosis not present

## 2024-08-30 ENCOUNTER — Other Ambulatory Visit (INDEPENDENT_AMBULATORY_CARE_PROVIDER_SITE_OTHER): Payer: Self-pay | Admitting: Gastroenterology

## 2024-08-30 DIAGNOSIS — K219 Gastro-esophageal reflux disease without esophagitis: Secondary | ICD-10-CM

## 2024-08-31 NOTE — Telephone Encounter (Signed)
 Please find out if patient had to restart pantoprazole ?

## 2024-09-04 NOTE — Telephone Encounter (Signed)
 Pt stated that she is not taking it and that she told the pharmacy to cancel it.

## 2024-09-04 NOTE — Telephone Encounter (Signed)
 Lmom for pt to return call

## 2024-09-07 ENCOUNTER — Encounter (INDEPENDENT_AMBULATORY_CARE_PROVIDER_SITE_OTHER): Payer: Self-pay | Admitting: Gastroenterology

## 2024-09-14 ENCOUNTER — Telehealth: Payer: Self-pay | Admitting: Internal Medicine

## 2024-09-14 NOTE — Telephone Encounter (Signed)
 Pt c/o BP issue: STAT if pt c/o blurred vision, one-sided weakness or slurred speech  1. What are your last 5 BP readings? 102/54  2. Are you having any other symptoms (ex. Dizziness, headache, blurred vision, passed out)?  Lightheaded and coughing a lot.   3. What is your BP issue? Pt thinks her BP medication just may be a little too much. She'd like to discuss further with a nurse. She stated she doesn't feel good and stays tired all the time. Please advise

## 2024-09-15 NOTE — Telephone Encounter (Signed)
 Patient returned RN's call.

## 2024-09-15 NOTE — Telephone Encounter (Signed)
 Left message to return call

## 2024-09-15 NOTE — Telephone Encounter (Signed)
 Spoke with patient - she is concerned about low BP.  States BP has been running low x 2 weeks or more.  BP's running 102-118 / 54-60.  Has not been keeping up with the HR's.  Has had a lot of changes since last OV.  Was dx with Type 2 DM & alpha gal.  She has had to change the way she eats & lost about 16 pounds.  No c/o chest pain or sob.  Does mention some slight lightheadedness when she gets the lower readings.  Has recently had cough but does state she has had some sinus drainage.  Also has had to stop taking pills with gel coating (glycerine) on them.  Medication list updated.  She is concerned that the Lopressor  dose may be too much for her now that she has lost the weight.

## 2024-09-18 NOTE — Telephone Encounter (Signed)
 Patient notified and verbalized understanding.

## 2024-09-21 DIAGNOSIS — L821 Other seborrheic keratosis: Secondary | ICD-10-CM | POA: Diagnosis not present

## 2024-09-21 DIAGNOSIS — L570 Actinic keratosis: Secondary | ICD-10-CM | POA: Diagnosis not present

## 2024-09-21 DIAGNOSIS — L820 Inflamed seborrheic keratosis: Secondary | ICD-10-CM | POA: Diagnosis not present

## 2024-09-21 DIAGNOSIS — L728 Other follicular cysts of the skin and subcutaneous tissue: Secondary | ICD-10-CM | POA: Diagnosis not present

## 2024-09-21 DIAGNOSIS — X32XXXA Exposure to sunlight, initial encounter: Secondary | ICD-10-CM | POA: Diagnosis not present

## 2024-09-27 DIAGNOSIS — E119 Type 2 diabetes mellitus without complications: Secondary | ICD-10-CM | POA: Diagnosis not present

## 2024-09-29 ENCOUNTER — Other Ambulatory Visit: Payer: Self-pay | Admitting: Gastroenterology

## 2024-09-29 DIAGNOSIS — K219 Gastro-esophageal reflux disease without esophagitis: Secondary | ICD-10-CM

## 2024-10-10 DIAGNOSIS — Z122 Encounter for screening for malignant neoplasm of respiratory organs: Secondary | ICD-10-CM | POA: Diagnosis not present

## 2024-10-10 DIAGNOSIS — F17210 Nicotine dependence, cigarettes, uncomplicated: Secondary | ICD-10-CM | POA: Diagnosis not present

## 2024-10-10 DIAGNOSIS — Z87891 Personal history of nicotine dependence: Secondary | ICD-10-CM | POA: Diagnosis not present

## 2024-10-19 ENCOUNTER — Other Ambulatory Visit: Payer: Self-pay

## 2024-10-19 DIAGNOSIS — K52832 Lymphocytic colitis: Secondary | ICD-10-CM

## 2024-10-19 DIAGNOSIS — T7819XA Other adverse food reactions, not elsewhere classified, initial encounter: Secondary | ICD-10-CM

## 2024-11-02 ENCOUNTER — Encounter: Payer: Self-pay | Admitting: Gastroenterology

## 2024-11-09 LAB — ALPHA-GAL PANEL
Allergen Lamb IgE: 0.17 kU/L — AB
Beef IgE: 0.51 kU/L — AB
IgE (Immunoglobulin E), Serum: 77 [IU]/mL (ref 6–495)
O215-IgE Alpha-Gal: 0.52 kU/L — AB
Pork IgE: 0.27 kU/L — AB

## 2024-11-10 ENCOUNTER — Telehealth: Payer: Self-pay

## 2024-11-10 NOTE — Telephone Encounter (Signed)
 Pt called wanting to know results to recent labs. Pt was advised that the provider has 5 business to get the results/recommendations back to her. Pt verbalized understanding.

## 2024-11-13 ENCOUNTER — Ambulatory Visit: Payer: Self-pay | Admitting: Gastroenterology

## 2024-11-13 NOTE — Telephone Encounter (Signed)
 See result note.

## 2024-11-17 ENCOUNTER — Other Ambulatory Visit (HOSPITAL_COMMUNITY): Payer: Self-pay | Admitting: General Practice

## 2024-11-17 DIAGNOSIS — Z1231 Encounter for screening mammogram for malignant neoplasm of breast: Secondary | ICD-10-CM

## 2024-12-18 ENCOUNTER — Ambulatory Visit: Admitting: Internal Medicine

## 2024-12-27 ENCOUNTER — Ambulatory Visit (HOSPITAL_COMMUNITY)
